# Patient Record
Sex: Female | Born: 1960 | Race: White | Hispanic: No | Marital: Married | Smoking: Never smoker
Health system: Southern US, Community
[De-identification: ages and names within clinical notes are randomized; demographics above are authoritative.]

## PROBLEM LIST (undated history)

## (undated) DIAGNOSIS — D869 Sarcoidosis, unspecified: Secondary | ICD-10-CM

## (undated) DIAGNOSIS — J3800 Paralysis of vocal cords and larynx, unspecified: Secondary | ICD-10-CM

## (undated) DIAGNOSIS — T8859XA Other complications of anesthesia, initial encounter: Secondary | ICD-10-CM

## (undated) DIAGNOSIS — K635 Polyp of colon: Secondary | ICD-10-CM

## (undated) DIAGNOSIS — E785 Hyperlipidemia, unspecified: Secondary | ICD-10-CM

## (undated) DIAGNOSIS — T7840XA Allergy, unspecified, initial encounter: Secondary | ICD-10-CM

## (undated) DIAGNOSIS — J3801 Paralysis of vocal cords and larynx, unilateral: Secondary | ICD-10-CM

## (undated) DIAGNOSIS — I712 Thoracic aortic aneurysm, without rupture, unspecified: Secondary | ICD-10-CM

## (undated) DIAGNOSIS — G902 Horner's syndrome: Secondary | ICD-10-CM

## (undated) DIAGNOSIS — Z8719 Personal history of other diseases of the digestive system: Secondary | ICD-10-CM

## (undated) DIAGNOSIS — T4145XA Adverse effect of unspecified anesthetic, initial encounter: Secondary | ICD-10-CM

## (undated) DIAGNOSIS — K2970 Gastritis, unspecified, without bleeding: Secondary | ICD-10-CM

## (undated) DIAGNOSIS — R0600 Dyspnea, unspecified: Secondary | ICD-10-CM

## (undated) DIAGNOSIS — F419 Anxiety disorder, unspecified: Secondary | ICD-10-CM

## (undated) DIAGNOSIS — K219 Gastro-esophageal reflux disease without esophagitis: Secondary | ICD-10-CM

## (undated) DIAGNOSIS — M810 Age-related osteoporosis without current pathological fracture: Secondary | ICD-10-CM

## (undated) HISTORY — DX: Horner's syndrome: G90.2

## (undated) HISTORY — DX: Gastro-esophageal reflux disease without esophagitis: K21.9

## (undated) HISTORY — DX: Thoracic aortic aneurysm, without rupture, unspecified: I71.20

## (undated) HISTORY — DX: Age-related osteoporosis without current pathological fracture: M81.0

## (undated) HISTORY — DX: Allergy, unspecified, initial encounter: T78.40XA

## (undated) HISTORY — DX: Hyperlipidemia, unspecified: E78.5

## (undated) HISTORY — DX: Paralysis of vocal cords and larynx, unspecified: J38.00

## (undated) HISTORY — PX: TONSILLECTOMY: SUR1361

## (undated) HISTORY — DX: Gastritis, unspecified, without bleeding: K29.70

## (undated) HISTORY — DX: Sarcoidosis, unspecified: D86.9

## (undated) HISTORY — DX: Anxiety disorder, unspecified: F41.9

## (undated) HISTORY — DX: Thoracic aortic aneurysm, without rupture: I71.2

## (undated) HISTORY — DX: Polyp of colon: K63.5

## (undated) SURGERY — BRONCHOSCOPY, WITH FLUOROSCOPY
Anesthesia: Moderate Sedation

---

## 1998-05-20 ENCOUNTER — Encounter: Admission: RE | Admit: 1998-05-20 | Discharge: 1998-05-20 | Payer: Self-pay | Admitting: *Deleted

## 1998-11-20 ENCOUNTER — Other Ambulatory Visit: Admission: RE | Admit: 1998-11-20 | Discharge: 1998-11-20 | Payer: Self-pay | Admitting: Obstetrics and Gynecology

## 1998-12-26 ENCOUNTER — Encounter: Payer: Self-pay | Admitting: Obstetrics and Gynecology

## 1998-12-26 ENCOUNTER — Ambulatory Visit (HOSPITAL_COMMUNITY): Admission: RE | Admit: 1998-12-26 | Discharge: 1998-12-26 | Payer: Self-pay | Admitting: Obstetrics and Gynecology

## 2000-04-06 ENCOUNTER — Other Ambulatory Visit: Admission: RE | Admit: 2000-04-06 | Discharge: 2000-04-06 | Payer: Self-pay | Admitting: Obstetrics and Gynecology

## 2001-10-26 ENCOUNTER — Encounter: Payer: Self-pay | Admitting: *Deleted

## 2001-10-26 ENCOUNTER — Encounter: Admission: RE | Admit: 2001-10-26 | Discharge: 2001-10-26 | Payer: Self-pay | Admitting: *Deleted

## 2002-02-27 ENCOUNTER — Other Ambulatory Visit: Admission: RE | Admit: 2002-02-27 | Discharge: 2002-02-27 | Payer: Self-pay | Admitting: Obstetrics and Gynecology

## 2002-06-13 ENCOUNTER — Other Ambulatory Visit: Admission: RE | Admit: 2002-06-13 | Discharge: 2002-06-13 | Payer: Self-pay

## 2003-02-26 ENCOUNTER — Emergency Department (HOSPITAL_COMMUNITY): Admission: EM | Admit: 2003-02-26 | Discharge: 2003-02-26 | Payer: Self-pay | Admitting: Emergency Medicine

## 2003-02-28 ENCOUNTER — Emergency Department (HOSPITAL_COMMUNITY): Admission: EM | Admit: 2003-02-28 | Discharge: 2003-02-28 | Payer: Self-pay | Admitting: *Deleted

## 2003-03-03 ENCOUNTER — Emergency Department (HOSPITAL_COMMUNITY): Admission: EM | Admit: 2003-03-03 | Discharge: 2003-03-03 | Payer: Self-pay

## 2003-03-07 ENCOUNTER — Encounter (HOSPITAL_COMMUNITY): Admission: RE | Admit: 2003-03-07 | Discharge: 2003-03-29 | Payer: Self-pay | Admitting: Family Medicine

## 2005-08-13 ENCOUNTER — Encounter: Admission: RE | Admit: 2005-08-13 | Discharge: 2005-08-13 | Payer: Self-pay | Admitting: Family Medicine

## 2007-06-20 ENCOUNTER — Encounter: Admission: RE | Admit: 2007-06-20 | Discharge: 2007-06-20 | Payer: Self-pay | Admitting: Obstetrics and Gynecology

## 2008-03-15 ENCOUNTER — Encounter: Admission: RE | Admit: 2008-03-15 | Discharge: 2008-03-15 | Payer: Self-pay | Admitting: Family Medicine

## 2011-01-06 ENCOUNTER — Other Ambulatory Visit: Payer: Self-pay | Admitting: Internal Medicine

## 2011-01-06 DIAGNOSIS — Z1231 Encounter for screening mammogram for malignant neoplasm of breast: Secondary | ICD-10-CM

## 2011-01-13 ENCOUNTER — Ambulatory Visit
Admission: RE | Admit: 2011-01-13 | Discharge: 2011-01-13 | Disposition: A | Discharge status: Home / Self Care | Payer: PRIVATE HEALTH INSURANCE | Source: Ambulatory Visit | Attending: Internal Medicine | Admitting: Internal Medicine

## 2011-01-13 DIAGNOSIS — Z1231 Encounter for screening mammogram for malignant neoplasm of breast: Secondary | ICD-10-CM

## 2012-07-31 ENCOUNTER — Other Ambulatory Visit: Payer: Self-pay | Admitting: Internal Medicine

## 2012-07-31 DIAGNOSIS — Z1231 Encounter for screening mammogram for malignant neoplasm of breast: Secondary | ICD-10-CM

## 2012-09-06 ENCOUNTER — Ambulatory Visit
Admission: RE | Admit: 2012-09-06 | Discharge: 2012-09-06 | Disposition: A | Discharge status: Home / Self Care | Payer: PRIVATE HEALTH INSURANCE | Source: Ambulatory Visit | Attending: Internal Medicine | Admitting: Internal Medicine

## 2012-09-06 DIAGNOSIS — Z1231 Encounter for screening mammogram for malignant neoplasm of breast: Secondary | ICD-10-CM

## 2012-09-07 ENCOUNTER — Other Ambulatory Visit: Payer: Self-pay | Admitting: Internal Medicine

## 2012-09-07 DIAGNOSIS — R928 Other abnormal and inconclusive findings on diagnostic imaging of breast: Secondary | ICD-10-CM

## 2012-09-22 ENCOUNTER — Other Ambulatory Visit: Payer: Self-pay | Admitting: Obstetrics and Gynecology

## 2012-09-22 DIAGNOSIS — R928 Other abnormal and inconclusive findings on diagnostic imaging of breast: Secondary | ICD-10-CM

## 2012-11-07 ENCOUNTER — Ambulatory Visit
Admission: RE | Admit: 2012-11-07 | Discharge: 2012-11-07 | Disposition: A | Discharge status: Home / Self Care | Payer: PRIVATE HEALTH INSURANCE | Source: Ambulatory Visit | Attending: Obstetrics and Gynecology | Admitting: Obstetrics and Gynecology

## 2012-11-07 DIAGNOSIS — R928 Other abnormal and inconclusive findings on diagnostic imaging of breast: Secondary | ICD-10-CM

## 2015-12-15 ENCOUNTER — Encounter: Payer: Self-pay | Admitting: Internal Medicine

## 2016-02-09 ENCOUNTER — Ambulatory Visit: Payer: PRIVATE HEALTH INSURANCE | Admitting: Internal Medicine

## 2016-02-18 ENCOUNTER — Ambulatory Visit (INDEPENDENT_AMBULATORY_CARE_PROVIDER_SITE_OTHER): Payer: Commercial Managed Care - PPO | Admitting: Internal Medicine

## 2016-02-18 ENCOUNTER — Encounter: Payer: Self-pay | Admitting: Internal Medicine

## 2016-02-18 VITALS — BP 100/60 | HR 66 | Ht 67.0 in | Wt 125.0 lb

## 2016-02-18 DIAGNOSIS — J385 Laryngeal spasm: Secondary | ICD-10-CM

## 2016-02-18 DIAGNOSIS — Z86010 Personal history of colonic polyps: Secondary | ICD-10-CM

## 2016-02-18 DIAGNOSIS — J387 Other diseases of larynx: Secondary | ICD-10-CM

## 2016-02-18 DIAGNOSIS — K219 Gastro-esophageal reflux disease without esophagitis: Secondary | ICD-10-CM

## 2016-02-18 DIAGNOSIS — Z800 Family history of malignant neoplasm of digestive organs: Secondary | ICD-10-CM | POA: Diagnosis not present

## 2016-02-18 DIAGNOSIS — D860 Sarcoidosis of lung: Secondary | ICD-10-CM

## 2016-02-18 MED ORDER — OMEPRAZOLE 20 MG PO CPDR: 20.0000 mg | DELAYED_RELEASE_CAPSULE | Freq: Every day | ORAL | Status: DC

## 2016-02-18 NOTE — Progress Notes (Signed)
Patient ID: Natasha Fowler, female   DOB: 1961/04/07, 55 y.o.   MRN: [id number] HPI: Natasha Fowler is a 55 year old female with past medical history of GERD, hyperlipidemia, pulmonary sarcoid and anxiety who is seen to evaluate GERD and LPR symptoms. She is here alone today. She reports that she has been unable to get her reflux and control over the last few months. She reports that she has had episodes of coughing particularly at night, spasm in her upper throat which makes it very difficult for her to breathe and provokes anxiety along with hoarseness. She has occasional heartburn. When she is eating solid food she denies dysphagia. Denies liquid dysphagia. Trouble swallowing occurs when she feels regurgitation and then coughing. She reports that she has been evaluated by Dr. Shana Chute in [facility] where multiple medications were tried and she underwent upper endoscopy and colonoscopy. She recalls previously being on multiple PPIs without benefit. He reports she tried a "holistic" approach and was seen at [facility] where she was told that she likely had acid deficit rather than too much acid. She was placed on a regimen of Betaine HCL, DHEA, enzyme aid digestion, and a probiotic. With this combination she reports "all of my symptoms get better". She reports being then seen by her primary care and she was told that this regimen was "ridiculous" and that she is setting herself up for "esophageal cancer". Her primary care than prescribed pantoprazole 40 mg daily which she states made her significantly worse. She has been off of pantoprazole for about a month and for the last 2 weeks been using branded Prilosec 20 mg a day. She recalls previously having tried Nexium and a higher dose of omeprazole. She feels that with Prilosec 20 mg daily her symptoms have been slightly improving. She has been seen by an ear nose and throat doctor and told that her symptoms were secondary to GERD after evaluation  including laryngoscopy. She does endorse lactose intolerance with a "tummyache" and loose stools if she has milk or ice cream. She avoids lactose. No other abdominal pain. Bowel habits have been regular without blood in her stool or melena.  She does describe at times she will wake up with regurgitation and developing severe tightness and the inability to breathe for a few seconds. She reports this induces panic and is very stressful for her.  Colonoscopy dated 09/17/2013 -- diminutive polyp at the appendiceal orifice removed with cold biopsy. Pathology = normal colonic mucosa. Otherwise normal exam Upper endoscopy performed 12/18/2013 -- regular and crisp Z line with nonobstructing esophageal ring at GE junction.  Feline appearance in esophageal mucosa which was biopsied. Hiatal hernia. Gastric erythema which was biopsied. Diminutive gastric polyp biopsied. Normal duodenum. Pathology = benign gastric mucosa with mild chronic gastritis. Negative for H. pylori. Gastric polyp consistent with fundic gland polyp. Esophageal biopsies benign squamous mucosa with no significant inflammation. No evidence of EoE  Past Medical History  Diagnosis Date  . Anxiety   . Arthritis   . GERD (gastroesophageal reflux disease)   . Hyperlipidemia   . Colon polyp     Past Surgical History  Procedure Laterality Date  . Tonsillectomy      age 16 , have grown back per Dr    No outpatient prescriptions prior to visit.   No facility-administered medications prior to visit.    Allergies  Allergen Reactions  . Penicillins Anaphylaxis    Throat swelling  . Asa [Aspirin] Other (See Comments)  GI upset  . Codeine Hives  . Sulfa Antibiotics Hives    Family History  Problem Relation Age of Onset  . Colon cancer Maternal Grandmother     in her 49's  . Colon polyps Mother     Social History  Substance Use Topics  . Smoking status: Never Smoker   . Smokeless tobacco: Never Used  . Alcohol Use: 0.0  oz/week    0 Standard drinks or equivalent per week     Comment: occ.    ROS: As per history of present illness, otherwise negative  BP 100/60 mmHg  Pulse 66  Ht 5\' 7"  (1.702 m)  Wt 125 lb (56.7 kg)  BMI 19.57 kg/m2 Constitutional: Well-developed and well-nourished. No distress. HEENT: Normocephalic and atraumatic. Oropharynx is clear and moist. No oropharyngeal exudate. Conjunctivae are normal.  No scleral icterus. Neck: Neck supple. Trachea midline. Cardiovascular: Normal rate, regular rhythm and intact distal pulses. No M/R/G Pulmonary/chest: Effort normal and breath sounds normal. No wheezing, rales or rhonchi. Abdominal: Soft, thin, nontender, nondistended. Bowel sounds active throughout. There are no masses palpable.  Extremities: no clubbing, cyanosis, or edema Lymphadenopathy: No cervical adenopathy noted. Neurological: Alert and oriented to person place and time. Skin: Skin is warm and dry. No rashes noted. Psychiatric: Normal mood and affect. Behavior is normal.  ASSESSMENT/PLAN: 55 year old female with past medical history of GERD, hyperlipidemia, pulmonary sarcoid and anxiety who is seen to evaluate GERD and LPR symptoms.  1. GERD/LPR -- she does have symptoms consistent with gastroesophageal reflux disease. We discussed dietary modification including decreasing her caffeine intake. She drinks considerable amounts of caffeine in the form of coffee on a daily basis. She seems to be improving with Prilosec 20 mg daily. I'm going to continue this dose. Given her dramatic response to the supplements started by integrative [facility], I recommended that she resume DHEA, probiotic and the digestive enzyme. I do not think she should take HCl. I recommended that she prop the head of her bed to help prevent nocturnal reflux. She does have classic LPR symptoms which should improve with overall improvement in GERD. Her symptoms also fit with laryngospasm and I have recommended that she be  evaluated by in pulmonary clinic. She's being referred to see Dr. Lake Bells. She also has history of pulmonary sarcoid.  2. CRC screening -- family history of colon cancer in her grandmother on her mother's side. Her mother had colon polyps which were precancerous. I recommended repeat colonoscopy in December 2019.

## 2016-02-18 NOTE — Patient Instructions (Addendum)
We have sent the following medications to your pharmacy for you to pick up at your convenience: Prilosec 20 mg daily  Patient advised to avoid spicy, acidic, citrus, chocolate, mints, fruit and fruit juices.  Limit the intake of caffeine, alcohol and Soda.  Don't exercise too soon after eating.  Don't lie down within 3-4 hours of eating.  Elevate the head of your bed.  Follow a GERD diet.  Decrease caffeine intake.  You may use your DHEA probiotic and digestive enzyme that was previously working well for you.  You have been scheduled for an appointment with Dr Roselie Awkward at [facility] (2nd floor of [place]) on Mar 11, 2016 @ 9:00 am with an 8:45 am arrival. Please make certain to bring your insurance cards, copay and updated medication list, including any over the counter medications/supplements.  You will be due for a recall colonoscopy in 09/2018. We will send you a reminder in the mail when it gets closer to that time.   If you are age 38 or older, your body mass index should be between 23-30. Your Body mass index is 19.57 kg/(m^2). If this is out of the aforementioned range listed, please consider follow up with your Primary Care Provider.  If you are age 40 or younger, your body mass index should be between 19-25. Your Body mass index is 19.57 kg/(m^2). If this is out of the aformentioned range listed, please consider follow up with your Primary Care Provider.

## 2016-03-11 ENCOUNTER — Encounter: Payer: Self-pay | Admitting: Pulmonary Disease

## 2016-03-11 ENCOUNTER — Ambulatory Visit (INDEPENDENT_AMBULATORY_CARE_PROVIDER_SITE_OTHER): Payer: Commercial Managed Care - PPO | Admitting: Pulmonary Disease

## 2016-03-11 VITALS — BP 124/68 | HR 66 | Ht 67.0 in | Wt 123.6 lb

## 2016-03-11 DIAGNOSIS — R0600 Dyspnea, unspecified: Secondary | ICD-10-CM | POA: Diagnosis not present

## 2016-03-11 DIAGNOSIS — D860 Sarcoidosis of lung: Secondary | ICD-10-CM | POA: Insufficient documentation

## 2016-03-11 DIAGNOSIS — R059 Cough, unspecified: Secondary | ICD-10-CM

## 2016-03-11 DIAGNOSIS — R9389 Abnormal findings on diagnostic imaging of other specified body structures: Secondary | ICD-10-CM

## 2016-03-11 DIAGNOSIS — R05 Cough: Secondary | ICD-10-CM | POA: Diagnosis not present

## 2016-03-11 DIAGNOSIS — R0602 Shortness of breath: Secondary | ICD-10-CM | POA: Diagnosis not present

## 2016-03-11 DIAGNOSIS — R938 Abnormal findings on diagnostic imaging of other specified body structures: Secondary | ICD-10-CM

## 2016-03-11 NOTE — Assessment & Plan Note (Signed)
She has been referred to me to evaluate shortness of breath, cough, in the setting of an underlying doses of sarcoidosis. It sounds as if her shortness of breath has been progressing over the last several months. I'm encouraged by the fact that her lungs were clear on exam today and she has normal oximetry.  However, she does have an abnormal CT scan from 2015 which showed scattered pulmonary nodules in a bronchovascular distribution with otherwise normal appearing pulmonary parenchyma. She is a lifelong nonsmoker.  It's not clear to me if the abnormal CT scan is the cause of her shortness of breath. See my discussion below.  I have a high index of suspicion for vocal cord dysfunction considering the episodic episode of the dyspnea, the upper airway wheezing, and her self-described very stressed life.  Plan: Pulmonary function testing High-resolution CT scan of the chest Obtain records from her prior pulmonologist including pulmonary function testing in 2015 If there is no evidence of progression of her ill-defined underlying pulmonary parenchymal problem then we will consider a bronchoscopy with biopsy versus evaluation of vocal cord dysfunction with ear nose and throat at [facility].

## 2016-03-11 NOTE — Progress Notes (Signed)
Subjective:    Patient ID: Natasha Fowler, female    DOB: Dec 06, 1960, 55 y.o.   MRN: [id number]  HPI Chief Complaint  Patient presents with  . Advice Only    Referred by Dr. Hilarie Fredrickson for Sarcoidosis.  Pt states 'a flap in her throat closes when coughing', unable to catch breath for several seconds- s/s present X3 mos.      Natasha Fowler was referred to me by Dr. Hilarie Fredrickson and is here today to establish care with a pulmonary group for two reasons: 1) "Extreme shortness of breath" She says that this occurs at rest and on exertion and sometimes just with coughing.  She describes progressive dyspnea, particularly in the last three months.  She says that this started several years ago but it was very subtle at that time.  However in the last few months this has progressed.  She feels that talking makes her run out of air.  Exertion such as climbing stairs, walking long distances on flat ground.  She feels scared when these episodes occur.  When she gets short of breath she feels like she "runs out of air" and she has to stop talking or walking and then it goes away.  These episodes are associated with hoarseness which worsens with these episodes.  She does not have chest pain when this happens.  She has noted wheezing perhaps 10% of the time this happens.  She describes the wheeze when she is otuside around tree pollen and it usually happens by a cough.  This wheezing typically occurs with sneezing, itchy, watery eyes and post nasal drip.  She takes zyrtec, but no nasal spray.    2) Cough She describes this as uncontrollable coughing.  She notes that this first happened suddenly when she was in the yard talking to the "gas man".  She started to have a severe episode of cough followed by dyspnea with a loud upper airway wheeze.  She said it felt like a flap had closed in her mouth and eventually she was able to breathe with some difficulty.  She saw her PCP for this was was described by him as a panic  attack.  She has dealt with cough for some time prior to this episode with the gas man.  She has since seen GI (pyrtle) and her cough has improved significantly with the addition of a PPI.   3) Sarcoidosis This was diagnosed in 2015. She was having a pain in her right abdomen at the time and she ended up having a CT scan.  Scarring vs nodules were seen in her lungs. She was seen by pulmonologist who told her that she had sarcoidosis.  She never had a biopsy or a tissue diagnosis. She was told to follow up but this never happened.    Past Medical History  Diagnosis Date  . Anxiety   . Arthritis   . GERD (gastroesophageal reflux disease)   . Hyperlipidemia   . Colon polyp      Family History  Problem Relation Age of Onset  . Colon cancer Maternal Grandmother     in her 7's  . Colon polyps Mother   . Heart disease Father      Social History   Social History  . Marital Status: Married    Spouse Name: N/A  . Number of Children: 1  . Years of Education: N/A   Occupational History  . general contractor    Social History Main Topics  . Smoking  status: Never Smoker   . Smokeless tobacco: Never Used  . Alcohol Use: 0.0 oz/week    0 Standard drinks or equivalent per week     Comment: occ.  . Drug Use: No  . Sexual Activity: Not on file   Other Topics Concern  . Not on file   Social History Narrative     Allergies  Allergen Reactions  . Penicillins Anaphylaxis    Throat swelling  . Asa [Aspirin] Other (See Comments)    GI upset  . Codeine Hives  . Sulfa Antibiotics Hives     Outpatient Prescriptions Prior to Visit  Medication Sig Dispense Refill  . estradiol-norethindrone (COMBIPATCH) 0.05-0.14 MG/DAY Place 1 patch onto the skin once a week.    Marland Kitchen omeprazole (PRILOSEC) 20 MG capsule Take 1 capsule (20 mg total) by mouth daily. Brand name prilosec only 30 capsule 2   No facility-administered medications prior to visit.       Review of Systems   Constitutional: Negative for fever and unexpected weight change.  HENT: Positive for congestion, sinus pressure and sneezing. Negative for dental problem, ear pain, nosebleeds, postnasal drip, rhinorrhea, sore throat and trouble swallowing.   Eyes: Negative for redness and itching.  Respiratory: Positive for cough and shortness of breath. Negative for chest tightness and wheezing.   Cardiovascular: Negative for palpitations and leg swelling.  Gastrointestinal: Negative for nausea and vomiting.  Genitourinary: Negative for dysuria.  Musculoskeletal: Negative for joint swelling.  Skin: Negative for rash.  Neurological: Negative for headaches.  Hematological: Does not bruise/bleed easily.  Psychiatric/Behavioral: Negative for dysphoric mood. The patient is not nervous/anxious.        Objective:   Physical Exam Filed Vitals:   03/11/16 0901  BP: 124/68  Pulse: 66  Height: 5\' 7"  (1.702 m)  Weight: 123 lb 9.6 oz (56.065 kg)  SpO2: 100%   RA  Gen: well appearing, no acute distress HENT: NCAT, OP clear, neck supple without masses Eyes: PERRL, EOMi Lymph: no cervical lymphadenopathy PULM: CTA B CV: RRR, no mgr, no JVD GI: BS+, soft, nontender, no hsm Derm: no rash or skin breakdown MSK: normal bulk and tone Neuro: A&Ox4, CN II-XII intact, strength 5/5 in all 4 extremities Psyche: normal mood and affect    Records from Dr. Hilarie Fredrickson were reviewed where she was treated for gastroesophageal reflux disease. Images from her 2015 CT chest for personally reviewed, see my summary below.      Assessment & Plan:  Dyspnea She has been referred to me to evaluate shortness of breath, cough, in the setting of an underlying doses of sarcoidosis. It sounds as if her shortness of breath has been progressing over the last several months. I'm encouraged by the fact that her lungs were clear on exam today and she has normal oximetry.  However, she does have an abnormal CT scan from 2015 which  showed scattered pulmonary nodules in a bronchovascular distribution with otherwise normal appearing pulmonary parenchyma. She is a lifelong nonsmoker.  It's not clear to me if the abnormal CT scan is the cause of her shortness of breath. See my discussion below.  I have a high index of suspicion for vocal cord dysfunction considering the episodic episode of the dyspnea, the upper airway wheezing, and her self-described very stressed life.  Plan: Pulmonary function testing High-resolution CT scan of the chest Obtain records from her prior pulmonologist including pulmonary function testing in 2015 If there is no evidence of progression of her ill-defined underlying  pulmonary parenchymal problem then we will consider a bronchoscopy with biopsy versus evaluation of vocal cord dysfunction with ear nose and throat at [facility].  Abnormal CT scan, chest I have personally reviewed the images from her CT chest which showed scattered and spiculated appearing pulmonary nodules in a bronchovascular distribution with central bronchiectasis. The differential diagnosis for this condition is broad. She was previously told that she had pulmonary sarcoidosis but this diagnosis cannot be made without a tissue biopsy. Differential diagnosis for this condition is broad and includes allergic bronchopulmonary aspergillosis among other problems.  Plan: Because of the increase in symptoms she's been experiencing recently we need to repeat a high-resolution CT chest to see if that there has been progression since the prior study. Depending on the results of the repeat CT scan we may need to perform a bronchoscopy  Cough The differential diagnosis for the etiology of her cough is broad but I think the most likely causes are acid reflux as well as allergic rhinitis. She describes symptoms of both of these underlying conditions and she notes significant improvement with cough with the addition of a proton pump  inhibitor.  Plan: Continue proton pump inhibitor as prescribed by her gastroenterologist We did review lifestyle and dietary modifications to improve gastroesophageal reflux symptoms     Current outpatient prescriptions:  .  estradiol-norethindrone (COMBIPATCH) 0.05-0.14 MG/DAY, Place 1 patch onto the skin once a week., Disp: , Rfl:  .  omeprazole (PRILOSEC) 20 MG capsule, Take 1 capsule (20 mg total) by mouth daily. Brand name prilosec only, Disp: 30 capsule, Rfl: 2

## 2016-03-11 NOTE — Assessment & Plan Note (Signed)
I have personally reviewed the images from her CT chest which showed scattered and spiculated appearing pulmonary nodules in a bronchovascular distribution with central bronchiectasis. The differential diagnosis for this condition is broad. She was previously told that she had pulmonary sarcoidosis but this diagnosis cannot be made without a tissue biopsy. Differential diagnosis for this condition is broad and includes allergic bronchopulmonary aspergillosis among other problems.  Plan: Because of the increase in symptoms she's been experiencing recently we need to repeat a high-resolution CT chest to see if that there has been progression since the prior study. Depending on the results of the repeat CT scan we may need to perform a bronchoscopy

## 2016-03-11 NOTE — Assessment & Plan Note (Signed)
The differential diagnosis for the etiology of her cough is broad but I think the most likely causes are acid reflux as well as allergic rhinitis. She describes symptoms of both of these underlying conditions and she notes significant improvement with cough with the addition of a proton pump inhibitor.  Plan: Continue proton pump inhibitor as prescribed by her gastroenterologist We did review lifestyle and dietary modifications to improve gastroesophageal reflux symptoms

## 2016-03-11 NOTE — Patient Instructions (Signed)
We will order a pulmonary function test and a high-resolution CT scan of your chest to evaluate the cause of her shortness of breath We will request records from your prior pulmonologist I will see you back in 4-6 weeks or sooner if needed

## 2016-03-16 ENCOUNTER — Ambulatory Visit (INDEPENDENT_AMBULATORY_CARE_PROVIDER_SITE_OTHER)
Admission: RE | Admit: 2016-03-16 | Discharge: 2016-03-16 | Disposition: A | Discharge status: Home / Self Care | Payer: Commercial Managed Care - PPO | Source: Ambulatory Visit | Attending: Pulmonary Disease | Admitting: Pulmonary Disease

## 2016-03-16 DIAGNOSIS — R9389 Abnormal findings on diagnostic imaging of other specified body structures: Secondary | ICD-10-CM

## 2016-03-16 DIAGNOSIS — R05 Cough: Secondary | ICD-10-CM

## 2016-03-16 DIAGNOSIS — R059 Cough, unspecified: Secondary | ICD-10-CM

## 2016-03-16 DIAGNOSIS — R0602 Shortness of breath: Secondary | ICD-10-CM | POA: Diagnosis not present

## 2016-03-19 ENCOUNTER — Telehealth: Payer: Self-pay | Admitting: Internal Medicine

## 2016-03-19 ENCOUNTER — Other Ambulatory Visit: Payer: Self-pay | Admitting: Obstetrics and Gynecology

## 2016-03-19 DIAGNOSIS — Z1231 Encounter for screening mammogram for malignant neoplasm of breast: Secondary | ICD-10-CM

## 2016-03-22 NOTE — Telephone Encounter (Signed)
Has she been taking the supplements recommended by [facility]? Change omeprazole to ranitidine 150 mg BID Pantoprazole previously made symptoms worse per patient

## 2016-03-22 NOTE — Telephone Encounter (Signed)
Left message for pt to call back  °

## 2016-03-22 NOTE — Telephone Encounter (Signed)
Pt states her reflux is worse than it has been, states the omeprazole 20mg  is not helping at all. Pt wants to know if there is something else that she can take. Please advise.

## 2016-03-23 MED ORDER — RANITIDINE HCL 150 MG PO CAPS: 150.0000 mg | ORAL_CAPSULE | Freq: Two times a day (BID) | ORAL | Status: DC

## 2016-03-23 NOTE — Addendum Note (Signed)
Addended by: Rosanne Sack R on: 03/23/2016 11:00 AM   Modules accepted: Orders

## 2016-03-23 NOTE — Progress Notes (Signed)
Quick Note:  Spoke with pt and notified of results per Dr. Melvyn Novas. Pt verbalized understanding and denied any questions. She is going to call us back and let us know about which day she can do bronch  Will hold is AC's result basket ______

## 2016-03-23 NOTE — Telephone Encounter (Signed)
Spoke with pt and she is aware, script sent to pharmacy. 

## 2016-03-31 ENCOUNTER — Telehealth: Payer: Self-pay | Admitting: Pulmonary Disease

## 2016-03-31 NOTE — Telephone Encounter (Signed)
Yes, either day fine.  Needs to be at [facility] in the morning only.  Fluoro yes, no TB risk

## 2016-03-31 NOTE — Telephone Encounter (Signed)
Spoke with the pt  She states that she is able to do bronch on Monday 19th or Tues 20th  Please advise if either of these dates work for you, thanks

## 2016-04-01 NOTE — Telephone Encounter (Signed)
Pt returning call to get info on appointments. Please call back

## 2016-04-01 NOTE — Telephone Encounter (Signed)
Spoke with Baxter Flattery at respiratory, Natasha Fowler is scheduled for Tuesday, June 20th at 11:00.   lmtcb X1 for pt to make aware of date/time. Forwarding to BQ as FYI

## 2016-04-01 NOTE — Telephone Encounter (Signed)
lmtcb for pt.  

## 2016-04-01 NOTE — Telephone Encounter (Signed)
Pt cb, I informed of dated and time of appt, also informed patient not to eat or drink after midnight the night before, and to arrange for someone to come with her to appt to drive her home, patient verbalized understanding and nothing further is needed

## 2016-04-06 ENCOUNTER — Ambulatory Visit (HOSPITAL_COMMUNITY): Payer: Commercial Managed Care - PPO

## 2016-04-06 ENCOUNTER — Telehealth: Payer: Self-pay | Admitting: Internal Medicine

## 2016-04-06 ENCOUNTER — Encounter (HOSPITAL_COMMUNITY): Payer: Self-pay | Admitting: Respiratory Therapy

## 2016-04-06 ENCOUNTER — Encounter (HOSPITAL_COMMUNITY)
Admission: RE | Disposition: A | Discharge status: Home / Self Care | Payer: Self-pay | Source: Ambulatory Visit | Attending: Pulmonary Disease

## 2016-04-06 ENCOUNTER — Ambulatory Visit (HOSPITAL_COMMUNITY)
Admission: RE | Admit: 2016-04-06 | Discharge: 2016-04-06 | Disposition: A | Discharge status: Home / Self Care | Payer: Commercial Managed Care - PPO | Source: Ambulatory Visit | Attending: Pulmonary Disease | Admitting: Pulmonary Disease

## 2016-04-06 DIAGNOSIS — J3801 Paralysis of vocal cords and larynx, unilateral: Secondary | ICD-10-CM | POA: Diagnosis not present

## 2016-04-06 DIAGNOSIS — R938 Abnormal findings on diagnostic imaging of other specified body structures: Secondary | ICD-10-CM

## 2016-04-06 DIAGNOSIS — M1990 Unspecified osteoarthritis, unspecified site: Secondary | ICD-10-CM | POA: Insufficient documentation

## 2016-04-06 DIAGNOSIS — J984 Other disorders of lung: Secondary | ICD-10-CM | POA: Insufficient documentation

## 2016-04-06 DIAGNOSIS — Z9889 Other specified postprocedural states: Secondary | ICD-10-CM

## 2016-04-06 DIAGNOSIS — J3800 Paralysis of vocal cords and larynx, unspecified: Secondary | ICD-10-CM

## 2016-04-06 DIAGNOSIS — R05 Cough: Secondary | ICD-10-CM | POA: Diagnosis not present

## 2016-04-06 DIAGNOSIS — D860 Sarcoidosis of lung: Secondary | ICD-10-CM

## 2016-04-06 HISTORY — PX: VIDEO BRONCHOSCOPY: SHX5072

## 2016-04-06 HISTORY — DX: Paralysis of vocal cords and larynx, unilateral: J38.01

## 2016-04-06 LAB — BODY FLUID CELL COUNT WITH DIFFERENTIAL
EOS FL: 2 %
Lymphs, Fluid: 10 %
MONOCYTE-MACROPHAGE-SEROUS FLUID: 61 % (ref 50–90)
NEUTROPHIL FLUID: 27 % — AB (ref 0–25)
WBC FLUID: 13 uL (ref 0–1000)

## 2016-04-06 SURGERY — BRONCHOSCOPY, WITH FLUOROSCOPY
Anesthesia: Moderate Sedation | Laterality: Bilateral

## 2016-04-06 MED ORDER — SODIUM CHLORIDE 0.9 % IV SOLN
INTRAVENOUS | Status: DC
  Administered 2016-04-06: 11:00:00 via INTRAVENOUS

## 2016-04-06 MED ORDER — FENTANYL CITRATE (PF) 100 MCG/2ML IJ SOLN
INTRAMUSCULAR | Status: DC | PRN
  Administered 2016-04-06 (×2): 25 ug via INTRAVENOUS
  Administered 2016-04-06: 50 ug via INTRAVENOUS
  Administered 2016-04-06: 25 ug via INTRAVENOUS

## 2016-04-06 MED ORDER — MIDAZOLAM HCL 10 MG/2ML IJ SOLN
INTRAMUSCULAR | Status: DC | PRN
  Administered 2016-04-06 (×2): 1 mg via INTRAVENOUS
  Administered 2016-04-06: 2 mg via INTRAVENOUS

## 2016-04-06 MED ORDER — LIDOCAINE HCL 1 % IJ SOLN
INTRAMUSCULAR | Status: DC | PRN
  Administered 2016-04-06: 6 mL via RESPIRATORY_TRACT

## 2016-04-06 MED ORDER — LIDOCAINE HCL 2 % EX GEL
CUTANEOUS | Status: DC | PRN
  Administered 2016-04-06: 1

## 2016-04-06 MED ORDER — LIDOCAINE HCL 2 % EX GEL: 1.0000 "application " | Freq: Once | CUTANEOUS | Status: DC

## 2016-04-06 MED ORDER — BUTAMBEN-TETRACAINE-BENZOCAINE 2-2-14 % EX AERO: 1.0000 | INHALATION_SPRAY | Freq: Once | CUTANEOUS | Status: DC

## 2016-04-06 MED ORDER — MIDAZOLAM HCL 5 MG/ML IJ SOLN
INTRAMUSCULAR | Status: AC
  Filled 2016-04-06: qty 2

## 2016-04-06 MED ORDER — PHENYLEPHRINE HCL 0.25 % NA SOLN: 1.0000 | Freq: Four times a day (QID) | NASAL | Status: DC | PRN

## 2016-04-06 MED ORDER — FENTANYL CITRATE (PF) 100 MCG/2ML IJ SOLN
INTRAMUSCULAR | Status: AC
  Filled 2016-04-06: qty 4

## 2016-04-06 MED ORDER — PHENYLEPHRINE HCL 0.25 % NA SOLN
NASAL | Status: DC | PRN
  Administered 2016-04-06: 2 via NASAL

## 2016-04-06 NOTE — Discharge Instructions (Signed)
Flexible Bronchoscopy, Care After These instructions give you information on caring for yourself after your procedure. Your doctor may also give you more specific instructions. Call your doctor if you have any problems or questions after your procedure. HOME CARE  Do not eat or drink anything for 2 hours after your procedure. If you try to eat or drink before the medicine wears off, food or drink could go into your lungs. You could also burn yourself.  After 2 hours have passed and when you can cough and gag normally, you may eat soft food and drink liquids slowly.  The day after the test, you may eat your normal diet.  You may do your normal activities.  Keep all doctor visits. GET HELP RIGHT AWAY IF:  You get more and more short of breath.  You get light-headed.  You feel like you are going to pass out (faint).  You have chest pain.  You have new problems that worry you.  You cough up more than a little blood.  You cough up more blood than before. MAKE SURE YOU:  Understand these instructions.  Will watch your condition.  Will get help right away if you are not doing well or get worse.   This information is not intended to replace advice given to you by your health care provider. Make sure you discuss any questions you have with your health care provider.   Document Released: 08/01/2009 Document Revised: 10/09/2013 Document Reviewed: 06/08/2013 Elsevier Interactive Patient Education 2016 [place].  Nothing to eat or drink until   1:30  pm today 04/06/2016

## 2016-04-06 NOTE — Telephone Encounter (Signed)
Pt states the zantac is not helping and she is coughing all night long. States when she was taking the prilosec daily the cough was better but she was still having problems with heartburn. Pt is going to stop the zantac and try taking prilosec 20mg  BID to see if this works for her. Pt wanted to know if she needs to come in for another visit. Please advise.

## 2016-04-06 NOTE — Op Note (Addendum)
[facility] Cardiopulmonary Patient Name: Natasha Fowler Procedure Date: 04/06/2016 MRN: [id number] Attending MD: Juanito Doom , MD Date of Birth: 1961-03-13 CSN: [id number] Age: 55 Admit Type: Outpatient Ethnicity: Not Hispanic or Latino Procedure:            Bronchoscopy Indications:          Chronic cough with abnormal CT Providers:            Ronie Spies. Kalvyn Desa, MD, Andre Lefort RRT,RCP, Cherre Huger RRT, RCP Referring MD:          Medicines:            Lidocaine 1% applied to cords 9 mL, Midazolam 4 mg IV,                        Fentanyl 0000000 mcg IV Complications:        No immediate complications Estimated Blood Loss: Estimated blood loss was minimal. Procedure:      Pre-Anesthesia Assessment:      - A History and Physical has been performed. Patient meds and allergies       have been reviewed. The risks and benefits of the procedure and the       sedation options and risks were discussed with the patient. All       questions were answered and informed consent was obtained. Patient       identification and proposed procedure were verified prior to the       procedure by the physician and the technician in the procedure room.       Mental Status Examination: alert and oriented. Airway Examination:       normal oropharyngeal airway. Respiratory Examination: clear to       auscultation. CV Examination: normal. ASA Grade Assessment: II - A       patient with mild systemic disease. After reviewing the risks and       benefits, the patient was deemed in satisfactory condition to undergo       the procedure. The anesthesia plan was to use moderate sedation /       analgesia (conscious sedation). Immediately prior to administration of       medications, the patient was re-assessed for adequacy to receive       sedatives. The heart rate, respiratory rate, oxygen saturations, blood       pressure, adequacy of pulmonary ventilation, and  response to care were       monitored throughout the procedure. The physical status of the patient       was re-assessed after the procedure.      After obtaining informed consent, the bronchoscope was passed under       direct vision. Throughout the procedure, the patient's blood pressure,       pulse, and oxygen saturations were monitored continuously. the [id number]       [id number]) scope was introduced through the right nostril and advanced to       the tracheobronchial tree. The procedure was accomplished without       difficulty. The patient tolerated the procedure well. The total duration       of the procedure was 9 minutes. Findings:      The larynx was notable for immobility of the left vocal cord, the right  moved normally. The tracheobronchial tree was normal in appearance       without lesion, mass or secretions.      Bronchoalveolar lavage was performed in the right middle lobe of the       lung and sent for cell count, bacterial culture, viral smears & culture,       and fungal & AFB analysis and cytology and flow cytometry. 60 mL of       fluid were instilled. 35 mL were returned. The return was cloudy. There       were no mucoid plugs in the return fluid.      Transbronchial biopsies of an area of infiltration were performed in the       anterior segment of the right upper lobe using forceps and sent for       histopathology examination. The procedure was guided by fluoroscopy.       Biopsy of lung tissue was obtained. Six biopsy passes were performed.       Six biopsy samples were obtained.      Estimated blood loss: minimal. Impression:      - Left vocal cord paralysis      - Chronic cough with abnormal CT      - Bronchoalveolar lavage was performed.      - Transbronchial lung biopsies were performed. Moderate Sedation:      Moderate (conscious) sedation was personally administered by the       endoscopist. The following parameters were monitored: oxygen  saturation,       heart rate, blood pressure, and response to care. Total physician       intraservice time was 14 minutes. Recommendation:      - Await BAL, biopsy, culture, cytology and washing results. Procedure Code(s):      --- Professional ---      [phone], Bronchoscopy, rigid or flexible, including fluoroscopic guidance,       when performed; with transbronchial lung biopsy(s), single lobe      [id number], Bronchoscopy, rigid or flexible, including fluoroscopic guidance,       when performed; with bronchial alveolar lavage      99152, Moderate sedation services provided by the same physician or       other qualified health care professional performing the diagnostic or       therapeutic service that the sedation supports, requiring the presence       of an independent trained observer to assist in the monitoring of the       patient's level of consciousness and physiological status; initial 15       minutes of intraservice time, patient age 45 years or older Diagnosis Code(s):      --- Professional ---      R05, Cough      R91.8, Other nonspecific abnormal finding of lung field CPT copyright 2016 American Medical Association. All rights reserved. The codes documented in this report are preliminary and upon coder review may  be revised to meet current compliance requirements. Norlene Campbell, MD Juanito Doom, MD 04/06/2016 12:01:06 PM This report has been signed electronically. Number of Addenda: 0 Scope In: 11:35:31 AM Scope Out: 11:44:37 AM

## 2016-04-06 NOTE — H&P (Signed)
  [facility] P[facility]M  HPI: Ms. Stika saw me a month ago for cough and has an abnormal CT chest that looks like sarcoidosis but she has never had a biopsy.  She is feeling OK, but her cough has been worse lately.  Past Medical History  Diagnosis Date  . Anxiety   . Arthritis   . GERD (gastroesophageal reflux disease)   . Hyperlipidemia   . Colon polyp      Family History  Problem Relation Age of Onset  . Colon cancer Maternal Grandmother     in her 19's  . Colon polyps Mother   . Heart disease Father      Social History   Social History  . Marital Status: Married    Spouse Name: N/A  . Number of Children: 1  . Years of Education: N/A   Occupational History  . general contractor    Social History Main Topics  . Smoking status: Never Smoker   . Smokeless tobacco: Never Used  . Alcohol Use: 0.0 oz/week    0 Standard drinks or equivalent per week     Comment: occ.  . Drug Use: No  . Sexual Activity: Not on file   Other Topics Concern  . Not on file   Social History Narrative     Allergies  Allergen Reactions  . Penicillins Anaphylaxis    Throat swelling  . Asa [Aspirin] Other (See Comments)    GI upset  . Codeine Hives  . Sulfa Antibiotics Hives     @encmedstart @  Filed Vitals:   04/06/16 1024 04/06/16 1057  BP:  103/67  Temp: 98.4 F (36.9 C)   Resp:  13  Height: 5\' 7"  (1.702 m)   Weight: 123 lb (55.792 kg)   SpO2:  100%   RA  Gen: well appearing HENT: OP clear, neck supple PULM: CTA B, normal percussion CV: RRR, no mgr, trace edema GI: BS+, soft, nontender Derm: no cyanosis or rash Psyche: normal mood and affect  CT chest > upperlobe predominant nodules bilaterally  Impression/Plan: Abnormal CT chest, likely sarcoidosis> plan bronchoscopy with biopsy today  Roselie Awkward, MD [place] PCCM Pager: [phone] Cell: [phone] After 3pm or if no response, call [phone]

## 2016-04-06 NOTE — Telephone Encounter (Signed)
Pt scheduled to see Dr. Hilarie Fredrickson 06/08/16@3pm . Appt letter mailed to pt.

## 2016-04-06 NOTE — Progress Notes (Signed)
Video Bronchoscopy done Intervention Bronchial washing Intervention Bronchial biopsy 

## 2016-04-06 NOTE — Telephone Encounter (Signed)
Next available?

## 2016-04-07 ENCOUNTER — Telehealth: Payer: Self-pay | Admitting: Pulmonary Disease

## 2016-04-07 DIAGNOSIS — J3800 Paralysis of vocal cords and larynx, unspecified: Secondary | ICD-10-CM

## 2016-04-07 LAB — ACID FAST SMEAR (AFB): ACID FAST SMEAR - AFSCU2: NEGATIVE

## 2016-04-07 LAB — ACID FAST SMEAR (AFB, MYCOBACTERIA)

## 2016-04-07 NOTE — Telephone Encounter (Signed)
Pt is wanting to know the name of the ENT that BQ recommended at her OV.  BQ - please advise. Thanks.

## 2016-04-07 NOTE — Telephone Encounter (Signed)
LVM for pt to return call

## 2016-04-07 NOTE — Telephone Encounter (Signed)
Patient states that she has paralyzed vocal chords, she says that this is making it hard for her to breath as well as talk.  She is very raspy sounding on the phone.  She said that she does not want to see Dr. Laurance Flatten again.  She said that Dr. Lake Bells mentioned another provider at the last OV and she wants to see that provider.  She said that she needs to be seen ASAP and needs the referral to be put in as URGENT because she has to take care of her mother and she cannot talk.    Dr. Lake Bells, please advise.

## 2016-04-07 NOTE — Telephone Encounter (Signed)
Pt called again, other message closed opened new one.Natasha Fowler

## 2016-04-07 NOTE — Telephone Encounter (Signed)
I called Natasha Fowler to let her know that the results of her bronchoscopy showed sarcoidosis, which is what we suspected all along.    However I had to leave a message.  She should see Dr. Laurance Flatten with ENT again (she said she had seen him in the past).  If this is mistaken, then I recommend that she see any of our ENT physicians in [place].

## 2016-04-08 ENCOUNTER — Encounter (HOSPITAL_COMMUNITY): Payer: Self-pay | Admitting: Pulmonary Disease

## 2016-04-08 LAB — CULTURE, RESPIRATORY W GRAM STAIN
Culture: NORMAL
Special Requests: NORMAL

## 2016-04-08 LAB — CULTURE, RESPIRATORY

## 2016-04-08 NOTE — Telephone Encounter (Signed)
Message was sent to [facility] yesterday. Will forward to [place] as well for Follow up.

## 2016-04-08 NOTE — Telephone Encounter (Signed)
Pt spoke to [place] and we gave her names and phone numbers to all the docs listed ok to close per [facility]

## 2016-04-08 NOTE — Telephone Encounter (Signed)
Carolin Coy, Benjamine Mola

## 2016-04-08 NOTE — Telephone Encounter (Signed)
Please have dr Lake Bells give her the name of the ENT doc that he told her about. She has called several times and just needs the name of a good ent and not dr Laurance Flatten.

## 2016-04-13 ENCOUNTER — Telehealth: Payer: Self-pay | Admitting: Pulmonary Disease

## 2016-04-13 NOTE — Telephone Encounter (Signed)
Called spoke with Anderson Malta with Dr. Pollie Friar office. I informed her that [place] at this [place]. She states that she will make Dr. Lucia Gaskins aware and if he has any more questions she will return our call. She voiced understanding and had no further questions.

## 2016-04-14 ENCOUNTER — Other Ambulatory Visit: Payer: Self-pay | Admitting: Otolaryngology

## 2016-04-14 DIAGNOSIS — R490 Dysphonia: Secondary | ICD-10-CM

## 2016-04-19 ENCOUNTER — Telehealth: Payer: Self-pay | Admitting: Pulmonary Disease

## 2016-04-19 DIAGNOSIS — J17 Pneumonia in diseases classified elsewhere: Principal | ICD-10-CM

## 2016-04-19 DIAGNOSIS — J168 Pneumonia due to other specified infectious organisms: Secondary | ICD-10-CM

## 2016-04-19 DIAGNOSIS — B49 Unspecified mycosis: Secondary | ICD-10-CM

## 2016-04-19 NOTE — Telephone Encounter (Signed)
I called Natasha Fowler to discuss the result of her BAL which grew Rhodotorula mucilaginosa  I think this is likely a contaminant, but anytime we assess folks for sarcoidosis we need to rule out atypical infections like this.  So I want her to see infectious disease, consult placed.  I had to leave a message explaining all this.  Have asked her to call back so triage can explain this.  Roselie Awkward, MD [place] PCCM Pager: [phone] Cell: [phone] After 3pm or if no response, call [phone]

## 2016-04-19 NOTE — Telephone Encounter (Signed)
Pt is calling for dr.mcquaid per his request. She isnt quite sure why, but she is calling him back

## 2016-04-21 ENCOUNTER — Telehealth: Payer: Self-pay | Admitting: Internal Medicine

## 2016-04-21 ENCOUNTER — Ambulatory Visit
Admission: RE | Admit: 2016-04-21 | Discharge: 2016-04-21 | Disposition: A | Discharge status: Home / Self Care | Payer: Commercial Managed Care - PPO | Source: Ambulatory Visit | Attending: Otolaryngology | Admitting: Otolaryngology

## 2016-04-21 ENCOUNTER — Telehealth: Payer: Self-pay | Admitting: Pulmonary Disease

## 2016-04-21 DIAGNOSIS — R490 Dysphonia: Secondary | ICD-10-CM

## 2016-04-21 MED ORDER — IOPAMIDOL (ISOVUE-300) INJECTION 61%
75.0000 mL | Freq: Once | INTRAVENOUS | Status: AC | PRN
  Administered 2016-04-21: 75 mL via INTRAVENOUS

## 2016-04-21 NOTE — Telephone Encounter (Signed)
Pt is returning call to see who dr Lake Bells wants to refer her to. Michela Pitcher he called her before the holiday.  [phone]

## 2016-04-21 NOTE — Telephone Encounter (Signed)
Spoke with pt and she was wanting to know who BQ had referred her to. Per chart he sent referral request to [facility] and it is under review. Advised pt that they should be contacting her in the next few days to schedule. Nothing further needed.

## 2016-04-21 NOTE — Telephone Encounter (Signed)
Pt reports her reflux/heartburn is terrible. States she is losing weight and cannot wait until August. Offered pt an appt for tomorrow and she states she has 2 other appts and cannot make it. Pt scheduled to see Alonza Bogus PA 04/26/16@2 :30pm. Pt aware of appt.

## 2016-04-22 ENCOUNTER — Encounter: Payer: Self-pay | Admitting: Pulmonary Disease

## 2016-04-22 ENCOUNTER — Ambulatory Visit (INDEPENDENT_AMBULATORY_CARE_PROVIDER_SITE_OTHER): Payer: Commercial Managed Care - PPO | Admitting: Pulmonary Disease

## 2016-04-22 VITALS — BP 100/64 | HR 84 | Ht 67.0 in | Wt 124.2 lb

## 2016-04-22 DIAGNOSIS — I729 Aneurysm of unspecified site: Secondary | ICD-10-CM

## 2016-04-22 DIAGNOSIS — R05 Cough: Secondary | ICD-10-CM | POA: Diagnosis not present

## 2016-04-22 DIAGNOSIS — R059 Cough, unspecified: Secondary | ICD-10-CM

## 2016-04-22 DIAGNOSIS — I712 Thoracic aortic aneurysm, without rupture, unspecified: Secondary | ICD-10-CM

## 2016-04-22 DIAGNOSIS — D860 Sarcoidosis of lung: Secondary | ICD-10-CM | POA: Diagnosis not present

## 2016-04-22 NOTE — Progress Notes (Signed)
Subjective:    Patient ID: Natasha Fowler, female    DOB: 02-06-1961, 55 y.o.   MRN: [id number]  Synopsis: Kilie Melesio had a transbronchial biopsy in June 2017 showing noncaseating granulomas. The BAL grew out a yeast. She also was noted to have vocal cord paralysis during that endoscopy.  HPI Chief Complaint  Patient presents with  . Follow-up    pt had ct and bronch since last ov- c/o hoarseness, nonprod cough.     Joelene Millin saw ENT after our bronchoscopy.  She still has vocal cord paralysis.  She is seeing Dr. Lucia Gaskins for this.  She had a CT scan of the neck.  She continues to struggle with severe heartburn on a daily basis.  She has cut out caffeine, alcohol, fatty foods and she has lost weight.  She is taking prilosec twice a day.  Taking the prilosec twice a day helps with her cough associated with "strangling on my reflux fluid".    She continues to struggle with dyspnea with exertion.  This is really worse when she is talking and multi-tasking. She can walk a mile at a time without severe dyspnea however.  Carrying groceries will.     Past Medical History  Diagnosis Date  . Anxiety   . Arthritis   . GERD (gastroesophageal reflux disease)   . Hyperlipidemia   . Colon polyp   . Vocal cord paralysis, unilateral complete 04/06/2016    left, noted on bronchoscopy      Review of Systems  Constitutional: Negative for fever, chills and fatigue.  HENT: Negative for rhinorrhea, sinus pressure and sneezing.   Respiratory: Positive for cough and shortness of breath. Negative for wheezing.   Cardiovascular: Negative for chest pain, palpitations and leg swelling.       Objective:   Physical Exam  Filed Vitals:   04/22/16 1506  BP: 100/64  Pulse: 84  Height: 5\' 7"  (1.702 m)  Weight: 124 lb 3.2 oz (56.337 kg)  SpO2: 100%  RA  Gen: well appearing HENT: OP clear, TM's clear, neck supple PULM: CTA B, normal percussion CV: RRR, no mgr, trace edema GI: BS+, soft,  nontender Derm: no cyanosis or rash Psyche: normal mood and affect    04/06/2016 Transbronchial biopsy > non-caseating granulomas 04/06/2016 BAL> Rhodotorula mucilaginosa  2015 CT chest images personally reviewed showing scattered, spiculated appearing pulmonary nodules in a bronchovascular distribution with some central bronchiectasis. 2015 [facility] (Dr. Gwenyth Allegra) evaluated the patient and recommended a bronchoscopy. She refused. She was treated with Advair. Serologies including serum ace, Anka, ANA, QuantiFERON goal, and hypersensitivity pneumonitis panel were reportedly normal. 02/2016 CT chest> progression of bronchovascular nodularity and calcified mediastinal lymphadenopathy     Assessment & Plan:  Paralyzed vocal cords Continue follow-up with her nose and throat.  This is contributing to her cough to some degree in the setting of reflux. If she does not have resolution of her vocal cord paralysis then she may need medialization.  Cough Comment K problem due to worsening acid reflux and vocal cord paralysis. Certainly the granulomatous lung disease is not helping, but I think that the acid reflux is that primary cause considering the fact that her cough has improved with increased antiacid therapy.  Plan: Continue follow-up with ENT for vocal cord paralysis Continue follow-up with gastroenterology for gastroesophageal reflux disease   Pulmonary sarcoidosis ([place]) At this time it looks like him has pulmonary sarcoidosis. However, she grew yeast from her bronchoalveolar lavage with which I'm not familiar.  Before we give her a formal diagnosis of sarcoid would need to make sure that this is not a true infection. I doubt it.  Plan: Refer to infectious diseases to evaluate this used Annual ophtho evaluations Hold off on steroid treatment for two reasons: I doubt it will improve her symptoms of cough, and to a don't want to suppress her immune system until she's been seen  by infectious diseases to evaluate this yeast. Because she has been having some dyspnea going to check for pulmonary function testing compare it to the one performed 2 years ago at [facility].   Thoracic aortic aneurysm [facility]) This was seen on her CT chest. I'll refer to thoracic surgery for further evaluation.     Current outpatient prescriptions:  .  estradiol-norethindrone (COMBIPATCH) 0.05-0.14 MG/DAY, Place 1 patch onto the skin once a week., Disp: , Rfl:  .  omeprazole (PRILOSEC) 20 MG capsule, Take 1 capsule (20 mg total) by mouth daily. Brand name prilosec only, Disp: 30 capsule, Rfl: 2

## 2016-04-22 NOTE — Patient Instructions (Signed)
We will refer you to infectious diseases We will refer you to thoracic surgery Follow-up with her nose and throat Follow-up with gastroenterology We will perform a lung function test We will see you back in 6 weeks

## 2016-04-22 NOTE — Assessment & Plan Note (Signed)
This was seen on her CT chest. I'll refer to thoracic surgery for further evaluation.

## 2016-04-22 NOTE — Assessment & Plan Note (Signed)
Comment K problem due to worsening acid reflux and vocal cord paralysis. Certainly the granulomatous lung disease is not helping, but I think that the acid reflux is that primary cause considering the fact that her cough has improved with increased antiacid therapy.  Plan: Continue follow-up with ENT for vocal cord paralysis Continue follow-up with gastroenterology for gastroesophageal reflux disease

## 2016-04-22 NOTE — Assessment & Plan Note (Addendum)
At this time it looks like him has pulmonary sarcoidosis. However, she grew yeast from her bronchoalveolar lavage with which I'm not familiar. Before we give her a formal diagnosis of sarcoid would need to make sure that this is not a true infection. I doubt it.  Plan: Refer to infectious diseases to evaluate this used Annual ophtho evaluations Hold off on steroid treatment for two reasons: I doubt it will improve her symptoms of cough, and to a don't want to suppress her immune system until she's been seen by infectious diseases to evaluate this yeast. Because she has been having some dyspnea going to check for pulmonary function testing compare it to the one performed 2 years ago at [facility].

## 2016-04-22 NOTE — Assessment & Plan Note (Signed)
Continue follow-up with her nose and throat.  This is contributing to her cough to some degree in the setting of reflux. If she does not have resolution of her vocal cord paralysis then she may need medialization.

## 2016-04-26 ENCOUNTER — Ambulatory Visit: Payer: PRIVATE HEALTH INSURANCE | Admitting: Gastroenterology

## 2016-05-04 ENCOUNTER — Ambulatory Visit: Payer: Commercial Managed Care - PPO | Admitting: Internal Medicine

## 2016-05-04 ENCOUNTER — Telehealth: Payer: Self-pay | Admitting: Internal Medicine

## 2016-05-04 NOTE — Telephone Encounter (Signed)
Pt states she is having lots of problems with reflux and is tired of the pain. Pt states medication is not working and she thinks she may need to have some sort of testing done. Pt scheduled to see Alonza Bogus PA 05/05/16@2pm . Pt aware of appt.

## 2016-05-04 NOTE — Telephone Encounter (Signed)
Left message for pt to call back  °

## 2016-05-05 ENCOUNTER — Encounter: Payer: Self-pay | Admitting: Gastroenterology

## 2016-05-05 ENCOUNTER — Encounter: Payer: PRIVATE HEALTH INSURANCE | Admitting: Cardiothoracic Surgery

## 2016-05-05 ENCOUNTER — Ambulatory Visit (INDEPENDENT_AMBULATORY_CARE_PROVIDER_SITE_OTHER): Payer: Commercial Managed Care - PPO | Admitting: Gastroenterology

## 2016-05-05 VITALS — BP 110/70 | HR 76 | Ht 67.0 in | Wt 123.8 lb

## 2016-05-05 DIAGNOSIS — K219 Gastro-esophageal reflux disease without esophagitis: Secondary | ICD-10-CM

## 2016-05-05 DIAGNOSIS — R1013 Epigastric pain: Secondary | ICD-10-CM

## 2016-05-05 NOTE — Patient Instructions (Addendum)
You have been scheduled for an abdominal ultrasound at [facility] (1st floor of hospital) on 05-13-2016 Thursday at 8:00 am. Please arrive at 7:45 am to your appointment for registration. Make certain not to have anything to eat or drink after midnight.Should you need to reschedule your appointment, please contact radiology at [phone]. This test typically takes about 30 minutes to perform.  We scheduled the 24 HR PH Probe test at [facility] , 1st floor. Date is 05-17-2016 Monday. Arrive at 8:00 am. Have nothing buy mouth after midnight.

## 2016-05-07 ENCOUNTER — Ambulatory Visit (HOSPITAL_COMMUNITY): Payer: Commercial Managed Care - PPO

## 2016-05-11 ENCOUNTER — Institutional Professional Consult (permissible substitution) (INDEPENDENT_AMBULATORY_CARE_PROVIDER_SITE_OTHER): Payer: Commercial Managed Care - PPO | Admitting: Cardiothoracic Surgery

## 2016-05-11 ENCOUNTER — Encounter: Payer: Self-pay | Admitting: Cardiothoracic Surgery

## 2016-05-11 VITALS — BP 121/72 | HR 77 | Resp 16 | Ht 67.0 in | Wt 123.0 lb

## 2016-05-11 DIAGNOSIS — I712 Thoracic aortic aneurysm, without rupture, unspecified: Secondary | ICD-10-CM

## 2016-05-11 LAB — FUNGUS CULTURE RESULT

## 2016-05-11 LAB — FUNGUS CULTURE WITH STAIN

## 2016-05-11 LAB — FUNGAL ORGANISM REFLEX

## 2016-05-11 NOTE — Progress Notes (Signed)
PCP is CABEZA,YURI, MD Referring Provider is Juanito Doom, MD  Chief Complaint  Patient presents with  . 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 The patient is a never smoker and does not have hypertension. There is no known family history of thoracic or abdominal aortic aneurysmal disease. There is no family history of aortic dissection or sudden death suggestive of aortic dissection. The patient's father was a heavy smoker and did have serious cardiac disease including cardiac surgery which was performed out of town and details are not available. The patient has mild hyperlipidemia with total cholesterol up to 230.  The patient has had epigastric pain compared to a clenched fist in her upper abdomen. She's been treated for reflux. She has been evaluated by gastroenterology and is undergoing further evaluation including pH monitoring and gallbladder studies. Upper endoscopy was performed and was unremarkable. Patient also has mid thoracic back pain. This is not temporally  related to exercise or exertion. She denies upper substernal pain.  Approximately 2 years ago the patient had CT scan of the chest which is reported--the ascending aorta at that time measured 4.1 cm. Images not available. In May 2017 the noncontrast CT scan of the chest measured the ascending aorta at 4.3 cm. However this is a noncontrasted study and the imaging of the aorta is suboptimal.  The patient's hoarseness has progressed over the past 1-2 years. CT of the neck with contrast was performed  by ENT which showed no abnormalities. CT scan of the chest shows calcified lymph nodes in the AP window related to the sarcoidosis which could potentially lie in the region of the left  recurrent laryngeal nerve  Sarcoidosis was documented by transbronchial biopsy showing noncaseating granuloma. PFTs are pending. The patient states her pulmonologist feels the sarcoidosis is mild and does not need therapy currently.  Past Medical History:  Diagnosis Date  . Anxiety   . Arthritis   . Colon polyp   . GERD (gastroesophageal reflux disease)   . Hyperlipidemia   . Vocal cord paralysis, unilateral complete 04/06/2016   left, noted on bronchoscopy    Past Surgical History:  Procedure Laterality Date  . TONSILLECTOMY     age 55 , have grown back per Dr  . Tamsen Roers BRONCHOSCOPY Bilateral 04/06/2016   Procedure: VIDEO BRONCHOSCOPY WITH FLUORO;  Surgeon: Juanito Doom, MD;  Location: [facility];  Service: Cardiopulmonary;  Laterality: Bilateral;    Family History  Problem Relation Age of Onset  . Colon cancer Maternal Grandmother     in her 5's  . Colon polyps Mother   . Heart disease Father     Social History Social History  Substance Use Topics  . Smoking status: Never Smoker  . Smokeless tobacco: Never Used  . Alcohol use No    Current Outpatient Prescriptions  Medication Sig Dispense Refill  . estradiol-norethindrone (COMBIPATCH) 0.05-0.14 MG/DAY Place 1 patch onto the skin once a week.    Marland Kitchen omeprazole (PRILOSEC) 20 MG capsule Take 1 capsule (20 mg total) by mouth daily. Brand name prilosec only 30 capsule 2  No current facility-administered medications for this visit.     Allergies  Allergen Reactions  . Penicillins Anaphylaxis    Throat swelling  . Asa [Aspirin] Other (See Comments)    GI upset  . Codeine Hives  . Sulfa Antibiotics Hives    Review of Systems        Review of Systems :  [ y ] = yes, [  ] = no        General :  Weight gain [   ]    Weight loss   [  yes 10-15 pounds ]  Fatigue [  ]  Fever [  ]  Chills  [  ]                                Weakness  [  ]           HEENT    Headache [  ]  Dizziness [  ]  Blurred vision [yes status post Lasik surgery  ] Glaucoma  [  ]                          Nosebleeds [  ] Painful or loose teeth [  ]        Cardiac :  Chest pain/ pressure [ yes epigastric ]  Resting SOB [  ] exertional SOB [  ]                        Orthopnea [  ]  Pedal edema  [  ]  Palpitations [  ] Syncope/presyncope [ ]                         Paroxysmal nocturnal dyspnea [  ]         Pulmonary : cough [he has nonproductive  ]  wheezing [  ]  Hemoptysis [  ] Sputum [  ] Snoring [  ]                              Pneumothorax [  ]  Sleep apnea [  ]        GI : Vomiting [  ]  Dysphagia [  ]  Melena  [  ]  Abdominal pain [  ] BRBPR [  ]              Heart burn [ yes ]  Constipation [  ] Diarrhea  [  ] Colonoscopy [   ]        GU : Hematuria [  ]  Dysuria [  ]  Nocturia [  ] UTI's [  ]        Vascular : Claudication [  ]  Rest pain [  ]  DVT [  ] Vein stripping [  ] leg ulcers [  ]                          TIA [  ] Stroke [  ]  Varicose veins [  ]        NEURO :  Headaches  [  ] Seizures [  ] Vision changes [yes status post Abril.Chestnut  ] Paresthesias [  ]  Seizures [  ]        Musculoskeletal :  Arthritis [  ] Gout  [  ]  Back pain [  yes  Joint pain [  ]        Skin :  Rash [  ]  Melanoma [  ] Sores [  ]        Heme : Bleeding problems [  ]Clotting Disorders [  ] Anemia [  ]Blood Transfusion [ ]         Endocrine : Diabetes [  ] Heat or Cold intolerance [  ] Polyuria [  ]excessive thirst [ ]         Psych : Depression [  ]  Anxiety [  ]  Psych hospitalizations [  ] Memory change [  ]     Patient's 51 year old son died 2 years ago.                                          BP 121/72   Pulse 77   Resp 16   Ht 5\' 7"  (1.702 m)   Wt 123 lb (55.8 kg)   SpO2 99% Comment: ON RA  BMI 19.26 kg/m   Physical Exam      Physical Exam  General: Healthy but thin-appearing middle-aged Caucasian female no acute distress HEENT: Normocephalic pupils equal , dentition adequate Neck: Supple without JVD, adenopathy, or bruit Chest: Clear to auscultation, symmetrical breath sounds, no rhonchi, no tenderness             or deformity Cardiovascular: Regular rate and rhythm, no murmur, no gallop, peripheral pulses             palpable in all extremities Abdomen:  Soft,, mild epigastric tenderness, no palpable mass or organomegaly Extremities: Warm, well-perfused, no clubbing cyanosis edema or tenderness,              no venous stasis changes of the legs Rectal/GU: Deferred Neuro: Grossly non--focal and symmetrical throughout Skin: Clean and dry without rash or ulceration   Diagnostic Tests: CT scan of chest images reviewed and fusiform ascending aneurysm 4.2 cm diameter is noted. No evidence of mural hematoma or endotracheal ulceration.  Impression: Her ascending aorta has a mild fusiform aneurysm This is at little or no risk for dissection at its current size. Surgery typically is not recommended until the diameter reaches 5.5 cm. Because she is a nonsmoker and does not have hypertension there is little risk  this will be a potential problem -she does not appear to have a connective tissue disorder. We will continue to monitor her thoracic aorta with surveillance scans. She will return in 6 months with a CTA of her aorta. Prior to that time the patient will have a transthoracic echocardiogram because of her atypical chest pain to evaluate possible mitral prolapse and assess her aortic root.  Plan: Echocardiogram          CTA chest for surveillance of her mildly dilated ascending aorta with return office visit   Len Childs, MD [facility] [phone]

## 2016-05-12 ENCOUNTER — Encounter: Payer: Self-pay | Admitting: Gastroenterology

## 2016-05-12 ENCOUNTER — Other Ambulatory Visit: Payer: Self-pay | Admitting: *Deleted

## 2016-05-12 DIAGNOSIS — R1013 Epigastric pain: Secondary | ICD-10-CM | POA: Insufficient documentation

## 2016-05-12 DIAGNOSIS — R079 Chest pain, unspecified: Secondary | ICD-10-CM

## 2016-05-12 DIAGNOSIS — K219 Gastro-esophageal reflux disease without esophagitis: Secondary | ICD-10-CM | POA: Insufficient documentation

## 2016-05-12 NOTE — Progress Notes (Addendum)
     05/05/2016 Natasha Fowler [id number] Nov 22, 1960   History of Present Illness:  This is a 55 year old female who was seen by Dr. Hilarie Fredrickson in May for GERD related symptoms.  Please see his note from 02/18/2016 for further details regarding her history.  She presents to our office today with similar symptoms but focuses more on pain that she describes to be under her shoulder blades and under her diaphragm/breasts after she eats.  This has been present for several months and has become constant and very bothersome.  Has a lot of belching as well.  Has been on several PPI's without success and says that prilosec seems to be the best, but is not working very well.  Denies any dysphagia.  Says that she has been doing everything lifestyle/dietary wise for her GERD with no improvements.  Says that her hoarseness actually ended up being a paralyzed vocal cord.  She is seeing a CT surgery next week for thoracic aortic aneurysm and is seeing infectious disease to help rule out pulmonary fungal infection that was suggested on bronchoscopy when she had this done with pulmonary.    Colonoscopy dated 09/17/2013 -- diminutive polyp at the appendiceal orifice removed with cold biopsy. Pathology = normal colonic mucosa. Otherwise normal exam Upper endoscopy performed 12/18/2013 -- regular and crisp Z line with nonobstructing esophageal ring at GE junction.  Feline appearance in esophageal mucosa which was biopsied. Hiatal hernia. Gastric erythema which was biopsied. Diminutive gastric polyp biopsied. Normal duodenum. Pathology = benign gastric mucosa with mild chronic gastritis. Negative for H. pylori. Gastric polyp consistent with fundic gland polyp. Esophageal biopsies benign squamous mucosa with no significant inflammation. No evidence of EoE   Current Medications, Allergies, Past Medical History, Past Surgical History, Family History and Social History were reviewed in [employer]  record.   Physical Exam: BP 110/70   Pulse 76   Ht 5\' 7"  (1.702 m)   Wt 123 lb 12.8 oz (56.2 kg)   BMI 19.39 kg/m  General: Well developed white female in no acute distress; anxious Head: Normocephalic and atraumatic Eyes:  Sclerae anicteric, conjunctiva pink  Ears: Normal auditory acuity Lungs: Clear throughout to auscultation Heart: Regular rate and rhythm Abdomen: Soft, non-distended.  Normal bowel sounds.  Mild epigastric TTP. Musculoskeletal: Symmetrical with no gross deformities  Extremities: No edema  Neurological: Alert oriented x 4, grossly non-focal Psychological:  Alert and cooperative. Normal mood and affect  Assessment and Recommendations: -55 year old female with GERD who describes epigastric abdominal pain and pain between her shoulder blades that is extremely bothersome to her over the past several months.  Also has a lot of belching.  She is very anxious and I wonder if this is causing or at least exacerbating her symptoms.  She is on PPI and has been for quite some time.  ? If she has uncontrolled GERD vs esophageal spasm vs gallbladder related issue.  Will start with abdominal ultrasound and 24 pH study while she is on her PPI.  ? If she will need a manometry as well.   -Vocal cord paralysis -Thoracic aortic aneurysm -Pulmonary sarcoidosis  Addendum: Reviewed and agree with management. Would proceed with 24-hour pH/impedance testing plus manometry. Agree with performing study on PPI to see if this is a true GERD issue or not. Jerene Bears, MD

## 2016-05-13 ENCOUNTER — Ambulatory Visit (HOSPITAL_COMMUNITY): Admission: RE | Admit: 2016-05-13 | Payer: Commercial Managed Care - PPO | Source: Ambulatory Visit

## 2016-05-17 ENCOUNTER — Encounter (HOSPITAL_COMMUNITY): Admission: RE | Payer: Self-pay | Source: Ambulatory Visit

## 2016-05-17 ENCOUNTER — Ambulatory Visit (HOSPITAL_COMMUNITY)
Admission: RE | Admit: 2016-05-17 | Payer: Commercial Managed Care - PPO | Source: Ambulatory Visit | Admitting: Internal Medicine

## 2016-05-17 SURGERY — MANOMETRY, ESOPHAGUS

## 2016-05-20 LAB — ACID FAST CULTURE WITH REFLEXED SENSITIVITIES (MYCOBACTERIA)

## 2016-05-20 LAB — ACID FAST CULTURE WITH REFLEXED SENSITIVITIES: ACID FAST CULTURE - AFSCU3: NEGATIVE

## 2016-05-25 ENCOUNTER — Ambulatory Visit (HOSPITAL_COMMUNITY): Payer: Commercial Managed Care - PPO | Attending: Cardiology

## 2016-05-25 ENCOUNTER — Other Ambulatory Visit: Payer: Self-pay

## 2016-05-25 ENCOUNTER — Encounter (INDEPENDENT_AMBULATORY_CARE_PROVIDER_SITE_OTHER): Payer: Self-pay

## 2016-05-25 DIAGNOSIS — I358 Other nonrheumatic aortic valve disorders: Secondary | ICD-10-CM | POA: Insufficient documentation

## 2016-05-25 DIAGNOSIS — I5189 Other ill-defined heart diseases: Secondary | ICD-10-CM | POA: Insufficient documentation

## 2016-05-25 DIAGNOSIS — I351 Nonrheumatic aortic (valve) insufficiency: Secondary | ICD-10-CM | POA: Insufficient documentation

## 2016-05-25 DIAGNOSIS — I340 Nonrheumatic mitral (valve) insufficiency: Secondary | ICD-10-CM | POA: Insufficient documentation

## 2016-05-25 DIAGNOSIS — I341 Nonrheumatic mitral (valve) prolapse: Secondary | ICD-10-CM | POA: Diagnosis not present

## 2016-05-25 DIAGNOSIS — I77810 Thoracic aortic ectasia: Secondary | ICD-10-CM | POA: Diagnosis not present

## 2016-05-25 DIAGNOSIS — E785 Hyperlipidemia, unspecified: Secondary | ICD-10-CM | POA: Diagnosis not present

## 2016-05-25 DIAGNOSIS — R079 Chest pain, unspecified: Secondary | ICD-10-CM | POA: Insufficient documentation

## 2016-05-27 ENCOUNTER — Encounter: Payer: Self-pay | Admitting: *Deleted

## 2016-06-01 ENCOUNTER — Ambulatory Visit: Payer: PRIVATE HEALTH INSURANCE | Admitting: Internal Medicine

## 2016-06-08 ENCOUNTER — Ambulatory Visit (INDEPENDENT_AMBULATORY_CARE_PROVIDER_SITE_OTHER): Payer: Commercial Managed Care - PPO | Admitting: Internal Medicine

## 2016-06-08 ENCOUNTER — Encounter: Payer: Self-pay | Admitting: Internal Medicine

## 2016-06-08 VITALS — BP 112/77 | HR 75 | Temp 97.9°F | Wt 123.0 lb

## 2016-06-08 VITALS — BP 122/60 | HR 76 | Ht 67.0 in | Wt 124.2 lb

## 2016-06-08 DIAGNOSIS — R059 Cough, unspecified: Secondary | ICD-10-CM

## 2016-06-08 DIAGNOSIS — B49 Unspecified mycosis: Secondary | ICD-10-CM | POA: Diagnosis not present

## 2016-06-08 DIAGNOSIS — J17 Pneumonia in diseases classified elsewhere: Secondary | ICD-10-CM

## 2016-06-08 DIAGNOSIS — J168 Pneumonia due to other specified infectious organisms: Secondary | ICD-10-CM

## 2016-06-08 DIAGNOSIS — R05 Cough: Secondary | ICD-10-CM

## 2016-06-08 DIAGNOSIS — K219 Gastro-esophageal reflux disease without esophagitis: Secondary | ICD-10-CM

## 2016-06-08 MED ORDER — RANITIDINE HCL 75 MG PO TABS: 75.0000 mg | ORAL_TABLET | Freq: Two times a day (BID) | ORAL | 2 refills | Status: DC

## 2016-06-08 NOTE — Patient Instructions (Signed)
We have sent the following medications to your pharmacy for you to pick up at your convenience: Ranitidine 75 mg twice daily (in place of prilosec)  Please follow up with Dr Hilarie Fredrickson in 3-4 months.  If you are age 55 or older, your body mass index should be between 23-30. Your Body mass index is 19.45 kg/m. If this is out of the aforementioned range listed, please consider follow up with your Primary Care Provider.  If you are age 29 or younger, your body mass index should be between 19-25. Your Body mass index is 19.45 kg/m. If this is out of the aformentioned range listed, please consider follow up with your Primary Care Provider.

## 2016-06-08 NOTE — Patient Instructions (Signed)
I will call you back in next 7-10 working days with results from today's visit as well as track down micro data from your bronchoscopy. Based upon those study results, we will see if need to have to schedule another visit.

## 2016-06-08 NOTE — Progress Notes (Signed)
Subjective:    Patient ID: Natasha Fowler, female    DOB: 1961/03/06, 55 y.o.   MRN: [id number]  HPI Natasha Fowler is a 55 year old female with a history of GERD, chronic cough, thoracic aortic aneurysm, vocal cord paralysis, pulmonary sarcoidosis versus fungal pneumonia who is here for follow-up. She was last seen in the office on 05/05/2016 by Alonza Bogus, PA-C. At this time she was having more frequent pain under her shoulder blades and diaphragm after she eats. She also had belching. At that visit her PPI was continued and she was using omeprazole 20 mg twice a day. She was scheduled for 24-hour pH and impedance testing with manometry to evaluate her symptoms and to try to determine if these were secondary to esophageal spasm, uncontrolled reflux, etc. This study was canceled due to the need to care for her mother in [place], [place]. She also been evaluated by CT surgery, pulmonology, ENT and now infectious disease.  She reported that she developed that epigastric pressure and also in her mid back under her shoulder blades. This felt different to her and not true heartburn. She was using Gas-X and Gaviscon. She decided to reduce Prilosec to once per day than every other day now every 3 days. The epigastric pressure and pain along with upper back pain have completely resolved. She was also noticing loose stools with her twice a day PPI. Currently she's having no heartburn or epigastric pain or pressure. Her tickling cough is back. This definitely improved with PPI. Bowel movements a been regular recently without blood in her stool or melena.  Review of Systems As per history of present illness, otherwise negative  Current Medications, Allergies, Past Medical History, Past Surgical History, Family History and Social History were reviewed in [employer] record.     Objective:   Physical Exam BP 122/60 (BP Location: Left Arm, Patient Position: Sitting, Cuff  Size: Normal)   Pulse 76   Ht 5\' 7"  (1.702 m)   Wt 124 lb 3.2 oz (56.3 kg)   BMI 19.45 kg/m  Constitutional: Well-developed and well-nourished. No distress. HEENT: Normocephalic and atraumatic. Conjunctivae are normal.  No scleral icterus. Neck: Neck supple. Trachea midline. Cardiovascular: Normal rate, regular rhythm and intact distal pulses.  Pulmonary/chest: Effort normal and breath sounds normal. No wheezing, rales or rhonchi. Abdominal: Soft, nontender, nondistended. Bowel sounds active throughout.  Extremities: no clubbing, cyanosis, or edema Neurological: Alert and oriented to person place and time. Skin: Skin is warm and dry. Psychiatric: Normal mood and affect. Behavior is normal.  GI procedure history:  Colonoscopy dated 09/17/2013 -- diminutive polyp at the appendiceal orifice removed with cold biopsy. Pathology = normal colonic mucosa. Otherwise normal exam  Upper endoscopy performed 12/18/2013 -- regular and crisp Z line with nonobstructing esophageal ring at GE junction. Feline appearance in esophageal mucosa which was biopsied. Hiatal hernia. Gastric erythema which was biopsied. Diminutive gastric polyp biopsied. Normal duodenum. Pathology = benign gastric mucosa with mild chronic gastritis. Negative for H. pylori. Gastric polyp consistent with fundic gland polyp. Esophageal biopsies benign squamous mucosa with no significant inflammation. No evidence of EoE    Assessment & Plan:  55 year old female with a history of GERD, chronic cough, thoracic aortic aneurysm, vocal cord paralysis, pulmonary sarcoidosis versus fungal pneumonia who is here for follow-up  1. GERD/chronic cough -- Symptoms of heartburn and reflux have resolved despite taking omeprazole only every 3 days. The epigastric pressure may have been a medication side effect. I  will list this in her intolerances. Going to have her stop PPI altogether due to lack of response and use ranitidine 75 mg twice a day. Cough  improved with acid suppression and this is the reason for twice a day H2 blocker. Hoarseness secondary to vocal cord paralysis. No history of GERD. Will hold on 24-hour pH impedance testing at present given improvement in overall symptoms.  2. Vocal cord paralysis -- per ENT  3. Sarcoidosis versus fungal pneumonia -- pulmonary and ID follow-up. Tests pending from ID appointment today  4. Thoracic aneurysm -- will follow with CT surgery, Dr. Prescott Gum.  4-6 month ROV, sooner PRN 25 minutes spent with the patient today. Greater than 50% was spent in counseling and coordination of care with the patient

## 2016-06-08 NOTE — Progress Notes (Signed)
Patient ID: Natasha Fowler, female   DOB: 02-07-1961, 55 y.o.   MRN: [id number]  HPI Natasha Fowler is a 55yo F who is referred to ID clinic for unusal cx finding after undergoing bronchoscopy for what is thought to be progressive sarcoidosis. 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   55yrs ago not diligent at wearing mask, since 2015 more diligent, wears hepa filter mask with dust/mold. No abstesos removal.   Scapular pain x 2-3 months.   No fever, chills, but has nightsweats - she attributes to menopause.-that she has been cutting back.arried  - exhausted. Tire all the time. Slept unexpectedly.    Outpatient Encounter Prescriptions as of 06/08/2016  Medication Sig  . estradiol-norethindrone (COMBIPATCH) 0.05-0.14 MG/DAY Place 1 patch onto the skin once a week.  Marland Kitchen omeprazole (PRILOSEC) 20 MG capsule Take 1 capsule (20 mg total) by mouth daily. Brand name prilosec only (Patient not taking: Reported on 06/08/2016)   No facility-administered encounter medications on file as of 06/08/2016.      Patient Active Problem List   Diagnosis Date Noted  . Abdominal pain, epigastric 05/12/2016  . Gastroesophageal reflux disease without esophagitis 05/12/2016  . Thoracic aortic aneurysm ([place]) 04/22/2016  . Paralyzed vocal cords 04/07/2016  . Dyspnea 03/11/2016  . Pulmonary sarcoidosis ([place]) 03/11/2016  . Cough 03/11/2016  - osteoporosis of hip - osteopenia in spine - tonsillectomy at age of 2  Health Maintenance Due  Topic Date Due  . Hepatitis C Screening  Nov 09, 1960  . HIV Screening  11/16/1975  . TETANUS/TDAP  11/16/1979  . PAP SMEAR  11/15/1981  . MAMMOGRAM  11/07/2014  . INFLUENZA VACCINE  05/18/2016    Social History  Substance Use Topics  .  Smoking status: Never Smoker  . Smokeless tobacco: Never Used  . Alcohol use No  family history includes Colon cancer in her maternal grandmother; Colon polyps in her mother; Heart disease in her father. Review of Systems  Physical Exam   BP 112/77 (BP Location: Right Arm, Patient Position: Sitting, Cuff Size: Normal)   Pulse 75   Temp 97.9 F (36.6 C) (Oral)   Wt 123 lb (55.8 kg)   SpO2 99%   BMI 19.26 kg/m   Physical Exam  Constitutional:  oriented to person, place, and time. appears well-developed and well-nourished. No distress.  HENT: [place]/AT, PERRLA, no scleral icterus Mouth/Throat: Oropharynx is clear and moist. No oropharyngeal exudate.  Cardiovascular: Normal rate, regular rhythm and normal heart sounds. Exam reveals no gallop and no friction rub.  No murmur heard.  Pulmonary/Chest: Effort normal and breath sounds normal. No respiratory distress.  has no wheezes.  Neck = supple, no nuchal rigidity Abdominal: Soft. Bowel sounds are normal.  exhibits no distension. There is no tenderness.  Lymphadenopathy: no cervical adenopathy. No axillary adenopathy Neurological: alert and oriented to person, place, and time.  Skin: Skin is warm and dry. No rash noted. No erythema.  Psychiatric: a normal mood and affect.  behavior is normal.    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 She does not appear to have clinical history to suggest immunocompromised state.   Recommend to get total immunoglobulin and hiv ab. If normal, would go ahead start treatment for sarcoidosis.  Health maintenance = will check hep c  Jhordan [phone]: call with results of aspergillus negative, b-d glucan negative

## 2016-06-10 LAB — ASPERGILLUS ANTIGEN,SERUM
Aspergillus Ag, EIA: NOT DETECTED
Index Value: 0.16

## 2016-06-11 LAB — FUNGITELL (1-3)-B-D-GLUCAN
(1-3)-B-D-Glucan: 42 pg/mL
INTERPRETATION: NEGATIVE

## 2016-06-11 LAB — ASPERGILLUS ANTIBODY BY IMMUNODIFF: ASPERGILLUS ANTIBODY ID: NEGATIVE

## 2016-06-11 LAB — ASPERGILLUS ANTIBODY (COMPLEMENT FIX): Aspergillus Antibody by Complement Fix: <1:8 {titer}

## 2016-06-16 ENCOUNTER — Ambulatory Visit (INDEPENDENT_AMBULATORY_CARE_PROVIDER_SITE_OTHER): Payer: Commercial Managed Care - PPO | Admitting: Pulmonary Disease

## 2016-06-16 ENCOUNTER — Encounter: Payer: Self-pay | Admitting: Pulmonary Disease

## 2016-06-16 DIAGNOSIS — D860 Sarcoidosis of lung: Secondary | ICD-10-CM

## 2016-06-16 DIAGNOSIS — J3800 Paralysis of vocal cords and larynx, unspecified: Secondary | ICD-10-CM

## 2016-06-16 DIAGNOSIS — R05 Cough: Secondary | ICD-10-CM

## 2016-06-16 DIAGNOSIS — R059 Cough, unspecified: Secondary | ICD-10-CM

## 2016-06-16 LAB — PULMONARY FUNCTION TEST
DL/VA % PRED: 83 %
DL/VA: 4.26 ml/min/mmHg/L
DLCO cor % pred: 73 %
DLCO cor: 20.53 ml/min/mmHg
DLCO unc % pred: 74 %
DLCO unc: 20.9 ml/min/mmHg
FEF 25-75 Post: 2.73 L/sec
FEF 25-75 Pre: 2.15 L/sec
FEF2575-%Change-Post: 26 %
FEF2575-%PRED-PRE: 79 %
FEF2575-%Pred-Post: 100 %
FEV1-%Change-Post: 6 %
FEV1-%PRED-PRE: 96 %
FEV1-%Pred-Post: 102 %
FEV1-POST: 3.02 L
FEV1-PRE: 2.84 L
FEV1FVC-%CHANGE-POST: 6 %
FEV1FVC-%PRED-PRE: 94 %
FEV6-%CHANGE-POST: 0 %
FEV6-%PRED-POST: 104 %
FEV6-%Pred-Pre: 104 %
FEV6-PRE: 3.79 L
FEV6-Post: 3.82 L
FEV6FVC-%PRED-PRE: 103 %
FEV6FVC-%Pred-Post: 103 %
FVC-%CHANGE-POST: 0 %
FVC-%PRED-POST: 101 %
FVC-%PRED-PRE: 101 %
FVC-POST: 3.82 L
FVC-Pre: 3.81 L
POST FEV6/FVC RATIO: 100 %
PRE FEV6/FVC RATIO: 100 %
Post FEV1/FVC ratio: 79 %
Pre FEV1/FVC ratio: 75 %
RV % PRED: 129 %
RV: 2.64 L
TLC % PRED: 111 %
TLC: 6.09 L

## 2016-06-16 NOTE — Assessment & Plan Note (Signed)
I believe she has sarcoidosis of the lungs, but fortunately she has completely normal lung function today and she has no signs or symptoms of active pulmonary disease. The only question is whether or not she has an infection from the [place] identified on her BAL. It is important to rule out infection from a fungal organism for making a diagnosis of sarcoidosis. She has a significant exposure history to molds. She will continue to follow-up with infectious disease regarding this.  Plan summary Continue follow-up with infectious disease No role for steroids at this time Continue surveillance with every 6 month pulmonary function testing and 6 minute walks She refuses flu shot Follow-up 6 months

## 2016-06-16 NOTE — Assessment & Plan Note (Signed)
Due to GERD. Continue antacid therapy. GI notes reviewed today where Zantac was recommended.

## 2016-06-16 NOTE — Patient Instructions (Signed)
Keep taking the antacid prescribed by gastroenterology Stay active We will see you back in 6 months with a lung function test, 6 minute walk on the next visit

## 2016-06-16 NOTE — Assessment & Plan Note (Signed)
Continue follow-up with ENT.  Greater than 50% of today's visit was spent face-to-face, 27 minute visit

## 2016-06-16 NOTE — Progress Notes (Signed)
   Subjective:    Patient ID: Natasha Fowler, female    DOB: 1961-03-25, 55 y.o.   MRN: [id number]  Synopsis: Natasha Fowler had a transbronchial biopsy in June 2017 showing noncaseating granulomas. The BAL grew out a yeast. She also was noted to have vocal cord paralysis during that endoscopy.  HPI No chief complaint on file.  Natasha Fowler says that she had a lt of problems with abdominal pain in her epigastrum and between her shoulder blades.  She stopped taking the prilosec and the symptoms went away. She has now weaned herself prilosec and no longer has the pain.  She is now taking Zantac.  While taking prilosec her cough went away.  The cough is now back again but is not as severe.    Past Medical History:  Diagnosis Date  . Anxiety   . Arthritis   . Colon polyp   . Gastritis   . GERD (gastroesophageal reflux disease)   . Hyperlipidemia   . Thoracic aortic aneurysm ([place])   . Vocal cord paralysis, unilateral complete 04/06/2016   left, noted on bronchoscopy      Review of Systems  Constitutional: Negative for chills, fatigue and fever.  HENT: Negative for rhinorrhea, sinus pressure and sneezing.   Respiratory: Positive for cough. Negative for shortness of breath and wheezing.   Cardiovascular: Negative for chest pain, palpitations and leg swelling.       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 2015 [facility] (Dr. Gwenyth Allegra) evaluated the patient and  recommended a bronchoscopy. She refused. She was treated with Advair. Serologies including serum ace, Anka, ANA, QuantiFERON goal, and hypersensitivity pneumonitis panel were reportedly normal. 02/2016 CT chest> progression of bronchovascular nodularity and calcified mediastinal lymphadenopathy     Assessment & Plan:  Pulmonary sarcoidosis ([place]) I believe she has sarcoidosis of the lungs, but fortunately she has completely normal lung function today and she has no signs or symptoms of active pulmonary disease. The only question is whether or not she has an infection from the [place] identified on her BAL. It is important to rule out infection from a fungal organism for making a diagnosis of sarcoidosis. She has a significant exposure history to molds. She will continue to follow-up with infectious disease regarding this.  Plan summary Continue follow-up with infectious disease No role for steroids at this time Continue surveillance with every 6 month pulmonary function testing and 6 minute walks She refuses flu shot Follow-up 6 months  Cough Due to GERD. Continue antacid therapy. GI notes reviewed today where Zantac was recommended.  Paralyzed vocal cords Continue follow-up with ENT.  Greater than 50% of today's visit was spent face-to-face, 27 minute visit    Current Outpatient Prescriptions:  .  estradiol-norethindrone (COMBIPATCH) 0.05-0.14 MG/DAY, Place 1 patch onto the skin once a week., Disp: , Rfl:

## 2016-08-26 ENCOUNTER — Other Ambulatory Visit: Payer: Self-pay | Admitting: *Deleted

## 2016-08-26 DIAGNOSIS — I712 Thoracic aortic aneurysm, without rupture, unspecified: Secondary | ICD-10-CM

## 2016-10-06 ENCOUNTER — Other Ambulatory Visit: Payer: Commercial Managed Care - PPO

## 2016-10-06 ENCOUNTER — Ambulatory Visit: Payer: Commercial Managed Care - PPO | Admitting: Cardiothoracic Surgery

## 2016-10-27 ENCOUNTER — Telehealth: Payer: Self-pay | Admitting: *Deleted

## 2016-10-27 NOTE — Telephone Encounter (Signed)
Placed call to patient to r/s f/u appt w/ Dr Prescott Gum and CTA that was missed on 12/20//left vm for patient to call back.

## 2016-10-29 ENCOUNTER — Telehealth: Payer: Self-pay | Admitting: Internal Medicine

## 2016-10-29 ENCOUNTER — Encounter: Payer: Self-pay | Admitting: *Deleted

## 2016-10-29 NOTE — Telephone Encounter (Signed)
Pt requesting to be seen asap for wt loss. States she has not been trying to lose weight, reports she just got back from a trip and thought she should have gained wt. States she is down to 115lbs. Pt scheduled to see Dr. Hilarie Fredrickson 11/01/16@8 :30am. Pt aware of appt.

## 2016-11-01 ENCOUNTER — Encounter: Payer: Self-pay | Admitting: Internal Medicine

## 2016-11-01 ENCOUNTER — Ambulatory Visit (INDEPENDENT_AMBULATORY_CARE_PROVIDER_SITE_OTHER): Payer: Commercial Managed Care - PPO | Admitting: Internal Medicine

## 2016-11-01 VITALS — BP 118/80 | HR 84 | Ht 66.5 in | Wt 117.5 lb

## 2016-11-01 DIAGNOSIS — R634 Abnormal weight loss: Secondary | ICD-10-CM | POA: Diagnosis not present

## 2016-11-01 DIAGNOSIS — K219 Gastro-esophageal reflux disease without esophagitis: Secondary | ICD-10-CM

## 2016-11-01 NOTE — Progress Notes (Signed)
Subjective:    Patient ID: Natasha Fowler, female    DOB: September 21, 1961, 56 y.o.   MRN: [id number]  HPI Chalita Boughman is a 56 year old female with history of GERD, chronic cough, thoracic aortic aneurysm, vocal cord paralysis, pulmonary sarcoid versus fungal pneumonia who is here for follow-up. She was last seen on 06/08/2016. After her last visit we recommended ranitidine twice daily for chronic cough, reflux symptomatology. She took this but developed similar symptoms with epigastric pain radiating to her back and upper abdominal bloating. This also happened when she used omeprazole. When she stops this the symptoms seemed to resolve. She still has "thick mucus" and "gurgling in the back of my throat" which she relates to reflux. She's not having much heartburn. She denies abdominal bloating. Overall her cough is been better. She has been losing weight which concerns her she is down 7 pounds from her visit in August and weighs 117 pounds today. She has dramatically reduced her food intake particularly fatty foods and her evening meals in order to improve reflux. She is now eating between 3 and 5 PM and nothing after. She also endorses considerable stress with her mother's health, her son graduated from college and having recently moved to [place]. Bowel movements have been soft occurring 1-2 times per day. No blood or melena in her stools. Occasionally she has the epigastric and middle abdominal discomfort which seems to come and go and is never consistent. This is worse with stress.  Recently she has been seen for head pressure, her left ear feeling "stopped up" and changes in her vision. She's also noticed that her right pupil is not as reactive as her left and also she feels like left eyelid droop. She was seen yesterday and has been referred for a head CT and neurology consultation. The symptoms of been present since mid December.   Review of Systems As per history of present illness, otherwise  negative  Current Medications, Allergies, Past Medical History, Past Surgical History, Family History and Social History were reviewed in [employer] record.     Objective:   Physical Exam BP 118/80 (BP Location: Left Arm, Patient Position: Sitting, Cuff Size: Normal)   Pulse 84   Ht 5' 6.5" (1.689 m) Comment: height measured without shoes  Wt 117 lb 8 oz (53.3 kg)   BMI 18.68 kg/m  Constitutional: Well-developed and well-nourished. No distress. HEENT: Normocephalic and atraumatic. Oropharynx is clear and moist. No oropharyngeal exudate. Conjunctivae are normal.  No scleral icterus. Neck: Neck supple. Trachea midline. Cardiovascular: Normal rate, regular rhythm and intact distal pulses. No M/R/G Pulmonary/chest: Effort normal and breath sounds normal. No wheezing, rales or rhonchi. Abdominal: Soft, thin, nondistended. Bowel sounds active throughout. There are no masses palpable. No hepatosplenomegaly. Extremities: no clubbing, cyanosis, or edema Lymphadenopathy: No cervical adenopathy noted. Neurological: Alert and oriented to person place and time. Skin: Skin is warm and dry. No rashes noted. Psychiatric: Normal mood and affect. Behavior is normal.     Assessment & Plan:  56 year old female with history of GERD, chronic cough, thoracic aortic aneurysm, vocal cord paralysis, pulmonary sarcoid versus fungal pneumonia who is here for follow-up  1. GERD -- her throat clearing, mucus and posterior oropharynx symptoms may be related to reflux. She has been intolerant H2 blocker and PPI due to upper abdominal pressure and discomfort. We had previously discussed doing a 24-hour pH study to determine whether or not she truly has pathologic reflux. For her this would be off  of therapy. For now she wishes to proceed with the CT scan of her head as well as neurology evaluation before further addressing this issue. She will continue GERD diet.  2. Weight loss -- this may be  secondary to diet and caloric reduction which for her is been significant trying to combat reflux type symptoms. She is also self reporting great deal of stress which can affect appetite. We discussed cross-sectional imaging versus ultrasound the abdomen to address the symptom but again she wishes to defer this for now and readdress at follow-up. --She has had prior EGD on the whole which was unremarkable as well as colonoscopy is up-to-date  She will return in 3-4 months, sooner if necessary at which point we can readdress overall symptoms including reflux and weight loss.  25 minutes spent with the patient today. Greater than 50% was spent in counseling and coordination of care with the patient

## 2016-11-01 NOTE — Patient Instructions (Addendum)
Please follow up in 3 months with Dr Hilarie Fredrickson.  Please follow a GERD diet.   Food Choices for Gastroesophageal Reflux Disease, Adult When you have gastroesophageal reflux disease (GERD), the foods you eat and your eating habits are very important. Choosing the right foods can help ease your discomfort. What guidelines do I need to follow?  Choose fruits, vegetables, whole grains, and low-fat dairy products.  Choose low-fat meat, fish, and poultry.  Limit fats such as oils, salad dressings, butter, nuts, and avocado.  Keep a food diary. This helps you identify foods that cause symptoms.  Avoid foods that cause symptoms. These may be different for everyone.  Eat small meals often instead of 3 large meals a day.  Eat your meals slowly, in a place where you are relaxed.  Limit fried foods.  Cook foods using methods other than frying.  Avoid drinking alcohol.  Avoid drinking large amounts of liquids with your meals.  Avoid bending over or lying down until 2-3 hours after eating. What foods are not recommended? These are some foods and drinks that may make your symptoms worse: Vegetables  Tomatoes. Tomato juice. Tomato and spaghetti sauce. Chili peppers. Onion and garlic. Horseradish. Fruits  Oranges, grapefruit, and lemon (fruit and juice). Meats  High-fat meats, fish, and poultry. This includes hot dogs, ribs, ham, sausage, salami, and bacon. Dairy  Whole milk and chocolate milk. Sour cream. Cream. Butter. Ice cream. Cream cheese. Drinks  Coffee and tea. Bubbly (carbonated) drinks or energy drinks. Condiments  Hot sauce. Barbecue sauce. Sweets/Desserts  Chocolate and cocoa. Donuts. Peppermint and spearmint. Fats and Oils  High-fat foods. This includes [place] fries and potato chips. Other  Vinegar. Strong spices. This includes black pepper, white pepper, red pepper, cayenne, curry powder, cloves, ginger, and chili powder. The items listed above may not be a complete list  of foods and drinks to avoid. Contact your dietitian for more information.  This information is not intended to replace advice given to you by your health care provider. Make sure you discuss any questions you have with your health care provider. Document Released: 04/04/2012 Document Revised: 03/11/2016 Document Reviewed: 08/08/2013 Elsevier Interactive Patient Education  2017 [employer].

## 2017-06-15 ENCOUNTER — Ambulatory Visit: Payer: Commercial Managed Care - PPO | Admitting: Cardiothoracic Surgery

## 2017-06-15 ENCOUNTER — Other Ambulatory Visit: Payer: Commercial Managed Care - PPO

## 2017-08-01 ENCOUNTER — Ambulatory Visit: Payer: Commercial Managed Care - PPO | Admitting: Pulmonary Disease

## 2017-08-12 ENCOUNTER — Ambulatory Visit (INDEPENDENT_AMBULATORY_CARE_PROVIDER_SITE_OTHER): Payer: Commercial Managed Care - PPO | Admitting: Pulmonary Disease

## 2017-08-12 ENCOUNTER — Encounter: Payer: Self-pay | Admitting: Pulmonary Disease

## 2017-08-12 VITALS — BP 120/68 | HR 75 | Ht 67.5 in | Wt 125.8 lb

## 2017-08-12 DIAGNOSIS — R0602 Shortness of breath: Secondary | ICD-10-CM

## 2017-08-12 DIAGNOSIS — I712 Thoracic aortic aneurysm, without rupture, unspecified: Secondary | ICD-10-CM

## 2017-08-12 DIAGNOSIS — D860 Sarcoidosis of lung: Secondary | ICD-10-CM | POA: Diagnosis not present

## 2017-08-12 NOTE — Patient Instructions (Signed)
Thoracic aortic aneurysm: We will arrange for a CT angiogram of your chest  Increasing shortness of breath in the setting of sarcoidosis: Pulmonary function test We will follow-up the images from the CT chest  We will see you back in late November

## 2017-08-12 NOTE — Progress Notes (Signed)
Subjective:    Patient ID: Natasha Fowler, female    DOB: [birth year], 56 y.o.   MRN: [id number]  Synopsis: Elisea Khader had a transbronchial biopsy in June 2017 showing noncaseating granulomas. The BAL grew out a yeast. She also was noted to have vocal cord paralysis during that endoscopy. [facility] (Dr. Gwenyth Allegra) evaluated the patient and recommended a bronchoscopy. She refused. She was treated with Advair. Serologies including serum ace, Anka, ANA, QuantiFERON goal, and hypersensitivity pneumonitis panel were reportedly normal.  HPI Chief Complaint  Patient presents with  . Follow-up    pt states breathing is doing well. pt reports sob with exertion & clearing throat.    Natasha Fowler says that back in January she woke up with a droopy left eye and was diagnosed with Horner's syndrome. She had a head MRI, cervical spine MRI.  They later recommended a chest CT chest, but she hasn't schedule this yet.   She tells me that she has dyspnea: > she now lives in [place] and is currently living in a camper > she says that she hikes a lot and she feels winded when she hikes > she says taht sometimes she feels like she is going to pass out with a brisk   She says that she has noticed more redness and itching over her eyes left greater than right.     Past Medical History:  Diagnosis Date  . Anxiety   . Arthritis   . Colon polyp   . Gastritis   . GERD (gastroesophageal reflux disease)   . Hyperlipidemia   . Sarcoidosis ([place])   . Thoracic aortic aneurysm ([place])   . Vocal cord paralysis   . Vocal cord paralysis, unilateral complete 04/06/2016   left, noted on bronchoscopy      Review of Systems  Constitutional: Negative for chills, fatigue and fever.  HENT: Negative for rhinorrhea, sinus pressure and sneezing.   Respiratory: Positive for cough and shortness of breath. Negative for wheezing.   Cardiovascular: Negative for chest pain, palpitations and leg swelling.         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 02/2016 CT chest> progression of bronchovascular nodularity and calcified mediastinal lymphadenopathy       Assessment & Plan:   Pulmonary sarcoidosis ([place]) - Plan: Pulmonary function test  Shortness of breath - Plan: Pulmonary function test  Thoracic aortic aneurysm without rupture ([place])  Discussion: Lorrena notes an increase in shortness of breath with heavy exercise over the last year.  She says that she has been intentionally exercising more but despite this she is having more shortness of breath.  This could be related to her sarcoidosis so we need to get a lung function test to follow-up further.  While we are evaluating her we will also order her CT angiogram to evaluate her thoracic aortic aneurysm as she did not have that earlier  this year.  We will also be able to see her pulmonary parenchyma on that study.  Plan: Thoracic aortic aneurysm: We will arrange for a CT angiogram of your chest  Increasing shortness of breath in the setting of sarcoidosis: Pulmonary function test We will follow-up the images from the CT chest  We will see you back in late November    Current Outpatient Prescriptions:  .  estradiol-norethindrone (COMBIPATCH) 0.05-0.14 MG/DAY, Place 1 patch onto the skin once a week., Disp: , Rfl:   .  predniSONE (DELTASONE) 10 MG tablet, Take by mouth as directed., Disp: , Rfl:

## 2017-09-16 ENCOUNTER — Ambulatory Visit (INDEPENDENT_AMBULATORY_CARE_PROVIDER_SITE_OTHER): Payer: Commercial Managed Care - PPO | Admitting: Pulmonary Disease

## 2017-09-16 ENCOUNTER — Ambulatory Visit (INDEPENDENT_AMBULATORY_CARE_PROVIDER_SITE_OTHER)
Admission: RE | Admit: 2017-09-16 | Discharge: 2017-09-16 | Disposition: A | Discharge status: Home / Self Care | Payer: Commercial Managed Care - PPO | Source: Ambulatory Visit | Attending: Pulmonary Disease | Admitting: Pulmonary Disease

## 2017-09-16 DIAGNOSIS — D860 Sarcoidosis of lung: Secondary | ICD-10-CM | POA: Diagnosis not present

## 2017-09-16 DIAGNOSIS — I712 Thoracic aortic aneurysm, without rupture, unspecified: Secondary | ICD-10-CM

## 2017-09-16 DIAGNOSIS — R0602 Shortness of breath: Secondary | ICD-10-CM

## 2017-09-16 LAB — PULMONARY FUNCTION TEST
DL/VA % PRED: 95 %
DL/VA: 4.88 ml/min/mmHg/L
DLCO UNC % PRED: 75 %
DLCO cor % pred: 89 %
DLCO cor: 25.11 ml/min/mmHg
DLCO unc: 21.02 ml/min/mmHg
FEF 25-75 PRE: 2.2 L/s
FEF 25-75 Post: 2.69 L/sec
FEF2575-%CHANGE-POST: 21 %
FEF2575-%Pred-Post: 101 %
FEF2575-%Pred-Pre: 83 %
FEV1-%Change-Post: 4 %
FEV1-%PRED-PRE: 94 %
FEV1-%Pred-Post: 99 %
FEV1-PRE: 2.76 L
FEV1-Post: 2.9 L
FEV1FVC-%Change-Post: 3 %
FEV1FVC-%Pred-Pre: 96 %
FEV6-%CHANGE-POST: 1 %
FEV6-%PRED-POST: 101 %
FEV6-%PRED-PRE: 99 %
FEV6-POST: 3.67 L
FEV6-PRE: 3.6 L
FEV6FVC-%Change-Post: 0 %
FEV6FVC-%PRED-POST: 103 %
FEV6FVC-%PRED-PRE: 102 %
FVC-%Change-Post: 1 %
FVC-%PRED-POST: 98 %
FVC-%Pred-Pre: 96 %
FVC-Post: 3.67 L
FVC-Pre: 3.61 L
POST FEV6/FVC RATIO: 100 %
Post FEV1/FVC ratio: 79 %
Pre FEV1/FVC ratio: 76 %
Pre FEV6/FVC Ratio: 100 %
RV % PRED: 98 %
RV: 2.03 L
TLC % pred: 101 %
TLC: 5.53 L

## 2017-09-16 MED ORDER — IOPAMIDOL (ISOVUE-370) INJECTION 76%
100.0000 mL | Freq: Once | INTRAVENOUS | Status: AC | PRN
  Administered 2017-09-16: 100 mL via INTRAVENOUS

## 2017-09-16 NOTE — Progress Notes (Signed)
PFT done today. 

## 2017-09-16 NOTE — Progress Notes (Signed)
Spoke with patient and made her aware of results. She verbalized understanding and did not have any questions. Nothing further is needed.

## 2017-09-19 ENCOUNTER — Ambulatory Visit: Payer: Commercial Managed Care - PPO | Admitting: Pulmonary Disease

## 2017-09-23 ENCOUNTER — Encounter: Payer: Self-pay | Admitting: Pulmonary Disease

## 2017-09-23 ENCOUNTER — Ambulatory Visit: Payer: Commercial Managed Care - PPO | Admitting: Pulmonary Disease

## 2017-09-23 VITALS — BP 118/70 | HR 75 | Ht 67.0 in | Wt 125.0 lb

## 2017-09-23 DIAGNOSIS — D860 Sarcoidosis of lung: Secondary | ICD-10-CM

## 2017-09-23 NOTE — Progress Notes (Signed)
Subjective:    Patient ID: Natasha Fowler, female    DOB: 04-06-1961, 56 y.o.   MRN: [id number]  Synopsis: Natasha Fowler had a transbronchial biopsy in June 2017 showing noncaseating granulomas. The BAL grew out a yeast. She also was noted to have vocal cord paralysis during that endoscopy. [facility] (Dr. Gwenyth Allegra) evaluated the patient and recommended a bronchoscopy. She refused. She was treated with Advair. Serologies including serum ace, Anka, ANA, QuantiFERON goal, and hypersensitivity pneumonitis panel were reportedly normal.  HPI Chief Complaint  Patient presents with  . Follow-up    results from CT scan   Natasha Fowler says that her breathing is been stable since last visit.  She has had a little bit more acid reflux which tends to make her breathing worse but for the most part she has not had much problems.  No recent cough or wheeze.  She has been exercising more and she says that when she climbs hills while hiking she notices that she will sometimes hold her breath and then she has to gasp her breathing afterwards.  In general though her breathing has been okay.  Past Medical History:  Diagnosis Date  . Anxiety   . Arthritis   . Colon polyp   . Gastritis   . GERD (gastroesophageal reflux disease)   . Hyperlipidemia   . Sarcoidosis   . Thoracic aortic aneurysm ([place])   . Vocal cord paralysis   . Vocal cord paralysis, unilateral complete 04/06/2016   left, noted on bronchoscopy      Review of Systems  Constitutional: Negative for chills, fatigue and fever.  HENT: Negative for rhinorrhea, sinus pressure and sneezing.   Respiratory: Positive for cough and shortness of breath. Negative for wheezing.   Cardiovascular: Negative for chest pain, palpitations and leg swelling.       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 02/2016 CT chest> progression of bronchovascular nodularity and calcified mediastinal lymphadenopathy November 2018 CT angiogram showed a 4 cm thoracic aortic aneurysm, stable mediastinal adenopathy and spiculated densities throughout the lungs.  Pulmonary function testing: November 2018 FVC 3.67 L, 98% predicted, DLCO 21.02 mL 75% predicted      Assessment & Plan:   Pulmonary sarcoidosis ([place]) - Plan: Pulmonary Function Test  Discussion: Lung function testing today showed a slight decrease in forced vital capacity but a stable DLCO compared to the last visit.  I have personally reviewed the CT scan of her chest from November and compared to the one from a year ago and I really see no significant change in the bilateral patchy airspace disease consistent with her known diagnosis of sarcoidosis.  So at this time I see no clear evidence that the sarcoid has progressed.  However, because of the decline in the SVC and her very slight perception of dyspnea in the last year I would like to repeat a pulmonary function test in  6 months to watch her a bit closer.  Plan: Pulmonary sarcoid: We will repeat pulmonary function testing in 6 months  Shortness of breath: Stay active, exercise regularly, try to take her mind off of the intensity of your exercise, try listening to music while exercising  We will see back in 6 months or sooner if needed    Current Outpatient Medications:  .  estradiol-norethindrone (COMBIPATCH)  0.05-0.14 MG/DAY, Place 1 patch onto the skin once a week., Disp: , Rfl:

## 2017-09-23 NOTE — Patient Instructions (Signed)
Pulmonary sarcoid: We will repeat pulmonary function testing in 6 months  Shortness of breath: Stay active, exercise regularly, try to take her mind off of the intensity of your exercise, try listening to music while exercising  We will see back in 6 months or sooner if needed

## 2018-01-03 ENCOUNTER — Telehealth: Payer: Self-pay | Admitting: Internal Medicine

## 2018-01-03 NOTE — Telephone Encounter (Signed)
Patient has been advised that she needs office visit as Dr Garth Schlatter last note indicates that she should follow up in 3 months from her last visit 10/2016. She is also only taking ranitidine PRN and has not filled rx since 2017. She is advised to get rx over the counter for now until her visit that she has scheduled with [place] in June 2019 at which time we can fill as a prescription if needed. She verbalizes understanding.

## 2018-03-21 ENCOUNTER — Ambulatory Visit: Payer: Commercial Managed Care - PPO | Admitting: Internal Medicine

## 2018-06-22 ENCOUNTER — Encounter

## 2018-06-22 ENCOUNTER — Ambulatory Visit: Payer: Commercial Managed Care - PPO | Admitting: Gastroenterology

## 2018-06-22 ENCOUNTER — Encounter: Payer: Self-pay | Admitting: Gastroenterology

## 2018-06-22 VITALS — BP 106/64 | HR 69 | Ht 67.0 in | Wt 119.0 lb

## 2018-06-22 DIAGNOSIS — R1031 Right lower quadrant pain: Secondary | ICD-10-CM

## 2018-06-22 DIAGNOSIS — R1011 Right upper quadrant pain: Secondary | ICD-10-CM

## 2018-06-22 DIAGNOSIS — R112 Nausea with vomiting, unspecified: Secondary | ICD-10-CM | POA: Insufficient documentation

## 2018-06-22 DIAGNOSIS — R197 Diarrhea, unspecified: Secondary | ICD-10-CM | POA: Diagnosis not present

## 2018-06-22 MED ORDER — NA SULFATE-K SULFATE-MG SULF 17.5-3.13-1.6 GM/177ML PO SOLN: 1.0000 | ORAL | 0 refills | Status: DC

## 2018-06-22 NOTE — Patient Instructions (Signed)

## 2018-06-22 NOTE — Progress Notes (Addendum)
06/22/2018 Natasha Fowler [id number] 09-21-1961   HISTORY OF PRESENT ILLNESS:  This is a 57 year old female who is a patient of Dr. Vena Rua.  She has a history of GERD, chronic cough, thoracic aortic aneurysm, vocal cord paralysis, anxiety, and sarcoidosis.  She is here today with complaints of nausea, diarrhea, and right sided abdominal pain.  She is very dramatic when describing her symptoms.  She reports severe intermittent nausea since May.  Says that when it is present it has her "bed-ridden".  She is on pantoprazole 40 mg daily and has been taking it every day for the past 3 weeks even though she does not like taking medication.  So far is not really sure that she has had any improvement.  Complains of a "gurggling or vibration sensation" in her right lower quadrant, no necessarily pain per se.  Describes some RUQ pains as well.  Gets diarrhea often after eating.  Weight is stable, actually up a couple of pounds since she was seen here in 10/2016.  She has multiple complaints today, reporting several non-GI issues as well.  Last EGD in 12/2013 at [facility] with GERD, esophageal ring, and gastric polyp.  Last colonoscopy was 09/2013 at [facility] showed a cecal polyp, but biopsies showed unremarkable mucosa.  Biopsies showed mild chronic gastritis and fundic gland polyp.  She was told to have another colonoscopy at 5 year interval due to family history (colon cancer in [facility] and polyps in her mother).    Past Medical History:  Diagnosis Date  . Anxiety   . Arthritis   . Colon polyp   . Gastritis   . GERD (gastroesophageal reflux disease)   . Hyperlipidemia   . Sarcoidosis   . Thoracic aortic aneurysm ([place])   . Vocal cord paralysis   . Vocal cord paralysis, unilateral complete 04/06/2016   left, noted on bronchoscopy   Past Surgical History:  Procedure Laterality Date  . TONSILLECTOMY     age 53 , have grown back per Dr  . Tamsen Roers BRONCHOSCOPY Bilateral 04/06/2016   Procedure: VIDEO  BRONCHOSCOPY WITH FLUORO;  Surgeon: Juanito Doom, MD;  Location: [facility];  Service: Cardiopulmonary;  Laterality: Bilateral;    reports that she has never smoked. She has never used smokeless tobacco. She reports that she does not drink alcohol or use drugs. family history includes Colon cancer in her maternal grandmother; Colon polyps in her mother; Heart disease in her father. Allergies  Allergen Reactions  . Penicillins Anaphylaxis    Throat swelling  . Asa [Aspirin] Other (See Comments)    GI upset  . Codeine Hives  . Prilosec [Omeprazole]     Epigastric pressure  . Sulfa Antibiotics Hives      Outpatient Encounter Medications as of 06/22/2018  Medication Sig  . estradiol-norethindrone (COMBIPATCH) 0.05-0.14 MG/DAY Place 1 patch onto the skin once a week.   No facility-administered encounter medications on file as of 06/22/2018.      REVIEW OF SYSTEMS  : All other systems reviewed and negative except where noted in the History of Present Illness.   PHYSICAL EXAM: BP 106/64   Pulse 69   Ht 5\' 7"  (1.702 m)   Wt 119 lb (54 kg)   BMI 18.64 kg/m  General: Well developed white female in no acute distress; somewhat pressured speech and anxious Head: Normocephalic and atraumatic Eyes:  Sclerae anicteric, conjunctiva pink. Ears: Normal auditory acuity Lungs: Clear throughout to auscultation; no increased WOB. Heart: Regular  rate and rhythm; no M/R/G. Abdomen: Soft, non-distended.  BS present.  Mild right sided TTP. Rectal:  Will be done at the time of colonoscopy. Musculoskeletal: Symmetrical with no gross deformities  Skin: No lesions on visible extremities Extremities: No edema  Neurological: Alert oriented x 4, grossly non-focal Psychological:  Alert and cooperative. Normal mood and affect  ASSESSMENT AND PLAN: *57 year old female with complaints of severe intermittent nausea, intermittent diarrhea (mostly post-prandial), and right sided abdominal pain, both RUQ  and RLQ.  Last colonoscopy 2014 and last EGD 2015 both relatively unremarkable.  She likely has IBS that accompanies her anxiety but is convinced that there are more severe issues.  Will schedule for EGD and colonscopy with Dr. Hilarie Fredrickson (due for surveillance colonoscopy in December anyway).  She does not like to take medication so wants to wait until procedures are performed (I offered levsin).  She was offered earlier procedures with other physicians but declined and preferred to choose and October date with Dr. Hilarie Fredrickson.  **The risks, benefits, and alternatives to EGD and colonoscopy were discussed with the patient and she consents to proceed.   CC:  Kristopher Glee., MD  Addendum: Reviewed and agree with initial management. Pyrtle, Lajuan Lines, MD

## 2018-06-30 DIAGNOSIS — R197 Diarrhea, unspecified: Secondary | ICD-10-CM | POA: Insufficient documentation

## 2018-08-10 ENCOUNTER — Ambulatory Visit: Payer: Commercial Managed Care - PPO | Admitting: Internal Medicine

## 2018-08-10 ENCOUNTER — Encounter: Payer: Self-pay | Admitting: Internal Medicine

## 2018-08-10 VITALS — BP 125/80 | HR 76 | Temp 99.3°F | Resp 12 | Ht 67.0 in | Wt 119.0 lb

## 2018-08-10 DIAGNOSIS — R112 Nausea with vomiting, unspecified: Secondary | ICD-10-CM

## 2018-08-10 DIAGNOSIS — G902 Horner's syndrome: Secondary | ICD-10-CM | POA: Insufficient documentation

## 2018-08-10 DIAGNOSIS — R1011 Right upper quadrant pain: Secondary | ICD-10-CM

## 2018-08-10 DIAGNOSIS — R1031 Right lower quadrant pain: Secondary | ICD-10-CM

## 2018-08-10 MED ORDER — SODIUM CHLORIDE 0.9 % IV SOLN: 500.0000 mL | Freq: Once | INTRAVENOUS | Status: DC

## 2018-08-10 MED ORDER — ONDANSETRON HCL 4 MG PO TABS: 4.0000 mg | ORAL_TABLET | Freq: Three times a day (TID) | ORAL | 0 refills | Status: DC | PRN

## 2018-08-10 NOTE — Op Note (Signed)
[facility] Patient Name: Natasha Fowler Procedure Date: 08/10/2018 2:43 PM MRN: [id number] Endoscopist: Jerene Bears , MD Age: 58 Referring MD:  Date of Birth: Dec 06, 1960 Gender: Female Account #: [id number] Procedure:                Colonoscopy Indications:              Colon cancer screening in patient at increased                            risk: Family history of 1st-degree relative with                            colon polyps, Last colonoscopy 5 years ago Medicines:                None; medications were administered (propofol)                            prior to planned upper endoscopy. Before the upper                            endoscope could be inserted per mouth the patient                            had a laryngospasm which took several minutes to                            resolve. She was treated with lidocaine and Robinul                            IV. The upper endoscopy was never performed. Procedure:                Pre-Anesthesia Assessment:                           - Prior to the procedure, a History and Physical                            was performed, and patient medications and                            allergies were reviewed. The patient's tolerance of                            previous anesthesia was also reviewed. The risks                            and benefits of the procedure and the sedation                            options and risks were discussed with the patient.                            All questions were answered, and informed consent  was obtained. Prior Anticoagulants: The patient has                            taken no previous anticoagulant or antiplatelet                            agents. ASA Grade Assessment: II - A patient with                            mild systemic disease. After reviewing the risks                            and benefits, the patient was deemed in   satisfactory condition to undergo the procedure.                           After obtaining informed consent, the colonoscope                            was passed under direct vision. Throughout the                            procedure, the patient's blood pressure, pulse, and                            oxygen saturations were monitored continuously. The                            Colonoscope was introduced through the anus with                            the intention of advancing to the cecum. The scope                            was advanced to the descending colon before the                            procedure was aborted. Medications were not given.                            The colonoscopy was performed without difficulty.                            The patient tolerated the procedure fairly well.                            The quality of the bowel preparation was good. Findings:                 The digital rectal exam was normal.                           A few small-mouthed diverticula were found in the  sigmoid colon. The unsedated exam was terminated                            due to patient discomfort.                           Retroflexion in the rectum was not performed due to                            aborting the procedure before completion. Complications:            No immediate complications. Estimated Blood Loss:     Estimated blood loss: none. Impression:               - Diverticulosis in the sigmoid colon.                           - Incomplete colonoscopy performed unsedated after                            sedation for upper endoscopy led to laryngospasm.                           - No specimens collected. Recommendation:           - Patient has a contact number available for                            emergencies. The signs and symptoms of potential                            delayed complications were discussed with the                             patient. Return to normal activities tomorrow.                            Written discharge instructions were provided to the                            patient.                           - Resume previous diet.                           - Continue present medications.                           - Repeat EGD/colonoscopy at appointment to be                            scheduled in the outpatient hospital setting with                            anesthesia given vocal cord dysfunction/spasm.                           -  ENT referral for evaluation of vocal cord                            paralysis and frequent vocal cord spasm/laryngal                            spasm Jerene Bears, MD 08/10/2018 3:43:24 PM This report has been signed electronically.

## 2018-08-10 NOTE — Patient Instructions (Signed)
YOU HAD AN ENDOSCOPIC PROCEDURE TODAY AT THE [facility]:   Refer to the procedure report that was given to you for any specific questions about what was found during the examination.  If the procedure report does not answer your questions, please call your gastroenterologist to clarify.  If you requested that your care partner not be given the details of your procedure findings, then the procedure report has been included in a sealed envelope for you to review at your convenience later.  YOU SHOULD EXPECT: Some feelings of bloating in the abdomen. Passage of more gas than usual.  Walking can help get rid of the air that was put into your GI tract during the procedure and reduce the bloating. If you had a lower endoscopy (such as a colonoscopy or flexible sigmoidoscopy) you may notice spotting of blood in your stool or on the toilet paper. If you underwent a bowel prep for your procedure, you may not have a normal bowel movement for a few days.  Please Note:  You might notice some irritation and congestion in your nose or some drainage.  This is from the oxygen used during your procedure.  There is no need for concern and it should clear up in a day or so.  SYMPTOMS TO REPORT IMMEDIATELY:   Following lower endoscopy (colonoscopy or flexible sigmoidoscopy):  Excessive amounts of blood in the stool  Significant tenderness or worsening of abdominal pains  Swelling of the abdomen that is new, acute  Fever of 100F or higher   Following upper endoscopy (EGD)  Vomiting of blood or coffee ground material  New chest pain or pain under the shoulder blades  Painful or persistently difficult swallowing  New shortness of breath  Fever of 100F or higher  Black, tarry-looking stools  For urgent or emergent issues, a gastroenterologist can be reached at any hour by calling [phone].   DIET:  We do recommend a small meal at first, but then you may proceed to your regular diet.  Drink  plenty of fluids but you should avoid alcoholic beverages for 24 hours.  ACTIVITY:  You should plan to take it easy for the rest of today and you should NOT DRIVE or use heavy machinery until tomorrow (because of the sedation medicines used during the test).    FOLLOW UP: Our staff will call the number listed on your records the next business day following your procedure to check on you and address any questions or concerns that you may have regarding the information given to you following your procedure. If we do not reach you, we will leave a message.  However, if you are feeling well and you are not experiencing any problems, there is no need to return our call.  We will assume that you have returned to your regular daily activities without incident.  If any biopsies were taken you will be contacted by phone or by letter within the next 1-3 weeks.  Please call us at [phone] if you have not heard about the biopsies in 3 weeks.    SIGNATURES/CONFIDENTIALITY: You and/or your care partner have signed paperwork which will be entered into your electronic medical record.  These signatures attest to the fact that that the information above on your After Visit Summary has been reviewed and is understood.  Full responsibility of the confidentiality of this discharge information lies with you and/or your care-partner. 

## 2018-08-10 NOTE — Progress Notes (Signed)
After induction and right before scope was inserted pt laryngspasmed.  Jaw thrust was immediatelly applied but did not break.  100mg  lidocaine given.  Several minutes of pt breathing normal then back to spasming.  No more sedation was given and pt allowed to wake up (approx 15 min).  Dr pyrtle explained  to pt in room what went on and told her we would not be doing the upper part and the only way we would do the colon was unsedated (since t spasmed with sedation only).  Pt agreed to try but asked [place] to stop in the sigmoid which we immediately did

## 2018-08-11 ENCOUNTER — Telehealth: Payer: Self-pay

## 2018-08-11 NOTE — Telephone Encounter (Signed)
  Follow up Call-  Call back number 08/10/2018  Post procedure Call Back phone  # [phone]  Permission to leave phone message Yes  Some recent data might be hidden     Patient questions:  Do you have a fever, pain , or abdominal swelling? No. Pain Score  0 *  Have you tolerated food without any problems? Yes.    Have you been able to return to your normal activities? Yes.    Do you have any questions about your discharge instructions: Diet   No. Medications  No. Follow up visit  No.  Do you have questions or concerns about your Care? No.  Actions: * If pain score is 4 or above: No action needed, pain <4.

## 2018-08-14 ENCOUNTER — Telehealth: Payer: Self-pay

## 2018-08-14 NOTE — Telephone Encounter (Signed)
Pt referral submitted via fax to [facility] [phone] (F)

## 2018-09-01 ENCOUNTER — Telehealth: Payer: Self-pay | Admitting: Internal Medicine

## 2018-09-01 NOTE — Telephone Encounter (Signed)
Pt called stating that she was returning Dr. Vena Rua nurse call. Pls try her again.

## 2018-09-01 NOTE — Telephone Encounter (Signed)
Does patient meet the criteria for care in [facility] given last attempt had laryngospasm?

## 2018-09-04 NOTE — Telephone Encounter (Signed)
Pt is calling back about scheduling a ECL before the end of year.  She said she saw her ENT and he told her it was okay to schedule the procedure

## 2018-09-04 NOTE — Telephone Encounter (Signed)
Once vocal cord is repaired by ENT, I think she would meet [facility] criteria.  She does not have a difficult airway, however she did have vocal cord dysfunction.  Thus, if this is repaired, she can likely have procedures in [place] Will check with J. Nulty to ensure anesthesia agrees [employer]

## 2018-09-04 NOTE — Telephone Encounter (Signed)
Okay, noted. Will determine if anesthesia okay proceeding in the [facility] If she has been seen by ENT at [facility] and they feel okay for procedures, I am okay proceeding

## 2018-09-04 NOTE — Telephone Encounter (Signed)
Dr. Hilarie Fredrickson,  With this pt's h/o laryngospasm with sedation, it would be prudent to perform her procedure in the hospital setting.  Thanks,  Osvaldo Angst

## 2018-09-04 NOTE — Telephone Encounter (Signed)
Pt called and states that she is not having surgery for her vocal cords she is having "voice therapy." [facility] MD states she should be ok for procedures but she could have another laryngospasm. Please advise if pt ok to do in the [place]. Note sent to Dr. Hilarie Fredrickson and Osvaldo Angst.

## 2018-09-07 ENCOUNTER — Telehealth: Payer: Self-pay | Admitting: Internal Medicine

## 2018-09-07 ENCOUNTER — Other Ambulatory Visit: Payer: Self-pay

## 2018-09-07 DIAGNOSIS — R112 Nausea with vomiting, unspecified: Secondary | ICD-10-CM

## 2018-09-07 DIAGNOSIS — Z1211 Encounter for screening for malignant neoplasm of colon: Secondary | ICD-10-CM

## 2018-09-07 NOTE — Telephone Encounter (Signed)
Pt scheduled for previsit 10/06/18@2pm , ECL scheduled at [facility] 10/23/18@9 :15am, pt to arrive there at 7:45am. Case# [id number]. Pt aware of appts and wants to be called if there are any cancellations.

## 2018-09-07 NOTE — Telephone Encounter (Signed)
Pt left msg with answering service. She called looking to schedule endo colon, per Dr. Vena Rua notes, procedure needs to be at the hosp. Pls call pt.

## 2018-09-26 ENCOUNTER — Other Ambulatory Visit: Payer: Self-pay | Admitting: Internal Medicine

## 2018-09-26 DIAGNOSIS — Z1231 Encounter for screening mammogram for malignant neoplasm of breast: Secondary | ICD-10-CM

## 2018-10-06 ENCOUNTER — Ambulatory Visit (AMBULATORY_SURGERY_CENTER): Payer: Self-pay

## 2018-10-06 VITALS — Ht 67.0 in | Wt 123.4 lb

## 2018-10-06 DIAGNOSIS — R197 Diarrhea, unspecified: Secondary | ICD-10-CM

## 2018-10-06 MED ORDER — NA SULFATE-K SULFATE-MG SULF 17.5-3.13-1.6 GM/177ML PO SOLN: 1.0000 | Freq: Once | ORAL | 0 refills | Status: AC

## 2018-10-06 NOTE — Progress Notes (Signed)
Denies allergies to eggs or soy products. Denies complication of anesthesia or sedation. Denies use of weight loss medication. Denies use of O2.   Emmi instructions declined.    A sample of Suprep was given to the patient.  

## 2018-10-13 ENCOUNTER — Encounter (HOSPITAL_COMMUNITY): Payer: Self-pay

## 2018-10-13 ENCOUNTER — Other Ambulatory Visit: Payer: Self-pay

## 2018-10-23 ENCOUNTER — Encounter (HOSPITAL_COMMUNITY)
Admission: RE | Disposition: A | Discharge status: Home / Self Care | Payer: Self-pay | Source: Home / Self Care | Attending: Internal Medicine

## 2018-10-23 ENCOUNTER — Ambulatory Visit (HOSPITAL_COMMUNITY): Payer: Commercial Managed Care - PPO | Admitting: Certified Registered Nurse Anesthetist

## 2018-10-23 ENCOUNTER — Other Ambulatory Visit: Payer: Self-pay

## 2018-10-23 ENCOUNTER — Encounter (HOSPITAL_COMMUNITY): Payer: Self-pay | Admitting: *Deleted

## 2018-10-23 ENCOUNTER — Ambulatory Visit (HOSPITAL_COMMUNITY)
Admission: RE | Admit: 2018-10-23 | Discharge: 2018-10-23 | Disposition: A | Discharge status: Home / Self Care | Payer: Commercial Managed Care - PPO | Attending: Internal Medicine | Admitting: Internal Medicine

## 2018-10-23 DIAGNOSIS — R197 Diarrhea, unspecified: Secondary | ICD-10-CM | POA: Insufficient documentation

## 2018-10-23 DIAGNOSIS — Z882 Allergy status to sulfonamides status: Secondary | ICD-10-CM | POA: Diagnosis not present

## 2018-10-23 DIAGNOSIS — Z91012 Allergy to eggs: Secondary | ICD-10-CM | POA: Insufficient documentation

## 2018-10-23 DIAGNOSIS — Z87892 Personal history of anaphylaxis: Secondary | ICD-10-CM | POA: Insufficient documentation

## 2018-10-23 DIAGNOSIS — F419 Anxiety disorder, unspecified: Secondary | ICD-10-CM | POA: Insufficient documentation

## 2018-10-23 DIAGNOSIS — K219 Gastro-esophageal reflux disease without esophagitis: Secondary | ICD-10-CM | POA: Diagnosis not present

## 2018-10-23 DIAGNOSIS — Z1211 Encounter for screening for malignant neoplasm of colon: Secondary | ICD-10-CM

## 2018-10-23 DIAGNOSIS — K5730 Diverticulosis of large intestine without perforation or abscess without bleeding: Secondary | ICD-10-CM | POA: Diagnosis not present

## 2018-10-23 DIAGNOSIS — K2210 Ulcer of esophagus without bleeding: Secondary | ICD-10-CM | POA: Insufficient documentation

## 2018-10-23 DIAGNOSIS — Z8371 Family history of colonic polyps: Secondary | ICD-10-CM | POA: Insufficient documentation

## 2018-10-23 DIAGNOSIS — D869 Sarcoidosis, unspecified: Secondary | ICD-10-CM | POA: Insufficient documentation

## 2018-10-23 DIAGNOSIS — K222 Esophageal obstruction: Secondary | ICD-10-CM | POA: Diagnosis not present

## 2018-10-23 DIAGNOSIS — D120 Benign neoplasm of cecum: Secondary | ICD-10-CM | POA: Diagnosis not present

## 2018-10-23 DIAGNOSIS — K449 Diaphragmatic hernia without obstruction or gangrene: Secondary | ICD-10-CM | POA: Diagnosis not present

## 2018-10-23 DIAGNOSIS — K529 Noninfective gastroenteritis and colitis, unspecified: Secondary | ICD-10-CM

## 2018-10-23 DIAGNOSIS — Z79899 Other long term (current) drug therapy: Secondary | ICD-10-CM | POA: Insufficient documentation

## 2018-10-23 DIAGNOSIS — K2950 Unspecified chronic gastritis without bleeding: Secondary | ICD-10-CM | POA: Insufficient documentation

## 2018-10-23 DIAGNOSIS — Z83719 Family history of colon polyps, unspecified: Secondary | ICD-10-CM

## 2018-10-23 DIAGNOSIS — I712 Thoracic aortic aneurysm, without rupture: Secondary | ICD-10-CM | POA: Diagnosis not present

## 2018-10-23 DIAGNOSIS — J385 Laryngeal spasm: Secondary | ICD-10-CM | POA: Diagnosis not present

## 2018-10-23 DIAGNOSIS — R1011 Right upper quadrant pain: Secondary | ICD-10-CM | POA: Diagnosis present

## 2018-10-23 DIAGNOSIS — D125 Benign neoplasm of sigmoid colon: Secondary | ICD-10-CM | POA: Insufficient documentation

## 2018-10-23 DIAGNOSIS — Z886 Allergy status to analgesic agent status: Secondary | ICD-10-CM | POA: Insufficient documentation

## 2018-10-23 DIAGNOSIS — R112 Nausea with vomiting, unspecified: Secondary | ICD-10-CM | POA: Diagnosis not present

## 2018-10-23 HISTORY — DX: Dyspnea, unspecified: R06.00

## 2018-10-23 HISTORY — DX: Personal history of other diseases of the digestive system: Z87.19

## 2018-10-23 HISTORY — PX: ESOPHAGOGASTRODUODENOSCOPY (EGD) WITH PROPOFOL: SHX5813

## 2018-10-23 HISTORY — PX: BIOPSY: SHX5522

## 2018-10-23 HISTORY — DX: Other complications of anesthesia, initial encounter: T88.59XA

## 2018-10-23 HISTORY — PX: POLYPECTOMY: SHX5525

## 2018-10-23 HISTORY — DX: Adverse effect of unspecified anesthetic, initial encounter: T41.45XA

## 2018-10-23 HISTORY — PX: COLONOSCOPY WITH PROPOFOL: SHX5780

## 2018-10-23 SURGERY — COLONOSCOPY WITH PROPOFOL
Anesthesia: Monitor Anesthesia Care

## 2018-10-23 MED ORDER — SODIUM CHLORIDE 0.9 % IV SOLN: INTRAVENOUS | Status: DC

## 2018-10-23 MED ORDER — PROPOFOL 10 MG/ML IV BOLUS
INTRAVENOUS | Status: AC
  Filled 2018-10-23: qty 40

## 2018-10-23 MED ORDER — LACTATED RINGERS IV SOLN
INTRAVENOUS | Status: DC
  Administered 2018-10-23: 08:00:00 via INTRAVENOUS

## 2018-10-23 MED ORDER — LIDOCAINE 2% (20 MG/ML) 5 ML SYRINGE
INTRAMUSCULAR | Status: DC | PRN
  Administered 2018-10-23: 100 mg via INTRAVENOUS

## 2018-10-23 MED ORDER — PHENYLEPHRINE 40 MCG/ML (10ML) SYRINGE FOR IV PUSH (FOR BLOOD PRESSURE SUPPORT)
PREFILLED_SYRINGE | INTRAVENOUS | Status: DC | PRN
  Administered 2018-10-23 (×2): 80 ug via INTRAVENOUS

## 2018-10-23 MED ORDER — MIDAZOLAM HCL 2 MG/2ML IJ SOLN
INTRAMUSCULAR | Status: AC
  Filled 2018-10-23: qty 2

## 2018-10-23 MED ORDER — PROPOFOL 10 MG/ML IV BOLUS
INTRAVENOUS | Status: DC | PRN
  Administered 2018-10-23: 20 mg via INTRAVENOUS

## 2018-10-23 MED ORDER — GLYCOPYRROLATE 0.2 MG/ML IJ SOLN
INTRAMUSCULAR | Status: DC | PRN
  Administered 2018-10-23: 0.2 mg via INTRAVENOUS

## 2018-10-23 MED ORDER — MIDAZOLAM HCL 5 MG/5ML IJ SOLN
INTRAMUSCULAR | Status: DC | PRN
  Administered 2018-10-23: 2 mg via INTRAVENOUS

## 2018-10-23 MED ORDER — PROPOFOL 500 MG/50ML IV EMUL
INTRAVENOUS | Status: DC | PRN
  Administered 2018-10-23: 150 ug/kg/min via INTRAVENOUS

## 2018-10-23 SURGICAL SUPPLY — 25 items

## 2018-10-23 NOTE — Op Note (Signed)
[facility] Patient Name: Natasha Fowler Procedure Date: 10/23/2018 MRN: [id number] Attending MD: Jerene Bears , MD Date of Birth: 1960-12-06 CSN: [id number] Age: 58 Admit Type: Outpatient Procedure:                Upper GI endoscopy Indications:              Abdominal pain in the right upper quadrant, Nausea Providers:                Lajuan Lines. Hilarie Fredrickson, MD, Burtis Junes, RN, Dorise Hiss, RN,                            Charolette Child, Technician, [facility], CRNA Referring MD:             Valaria Good. Karle Starch Medicines:                Monitored Anesthesia Care Complications:            No immediate complications. Estimated Blood Loss:     Estimated blood loss was minimal. Procedure:                Pre-Anesthesia Assessment:                           - Prior to the procedure, a History and Physical                            was performed, and patient medications and                            allergies were reviewed. The patient's tolerance of                            previous anesthesia was also reviewed. The risks                            and benefits of the procedure and the sedation                            options and risks were discussed with the patient.                            All questions were answered, and informed consent                            was obtained. Prior Anticoagulants: The patient has                            taken no previous anticoagulant or antiplatelet                            agents. ASA Grade Assessment: II - A patient with                            mild systemic disease. After reviewing the risks  and benefits, the patient was deemed in                            satisfactory condition to undergo the procedure.                           After obtaining informed consent, the endoscope was                            passed under direct vision. Throughout the                            procedure, the  patient's blood pressure, pulse, and                            oxygen saturations were monitored continuously. The                            GIF-H190 ([id number]) Olympus adult endoscope was                            introduced through the mouth, and advanced to the                            second part of duodenum. The upper GI endoscopy was                            accomplished without difficulty. The patient                            tolerated the procedure well. Scope In: Scope Out: Findings:      In the proximal esophagus there was a small inlet patch without       nodularity or visible abnormality.      Mild inflammation characterized by linear furrowing and subtle rings was       found in the middle third of the esophagus and in the lower third of the       esophagus. Biopsies were obtained from the proximal and distal esophagus       with cold forceps for histology to exclude eosinophilic esophagitis.      A 2 cm hiatal hernia was present.      A non-obstructing Schatzki ring was found at the gastroesophageal       junction.      Mild inflammation characterized by erythema was found in the gastric       body and in the gastric antrum. Biopsies were taken with a cold forceps       for histology and Helicobacter pylori testing.      The examined duodenum was normal. Biopsies for histology were taken with       a cold forceps for evaluation of celiac disease. Impression:               - Mild esophagitis, probably reflux related.                            Biopsied.                           -  2 cm hiatal hernia.                           - Non-obstructing Schatzki ring.                           - Gastritis. Biopsied.                           - Normal examined duodenum. Biopsied. Moderate Sedation:      N/A Recommendation:           - Patient has a contact number available for                            emergencies. The signs and symptoms of potential                             delayed complications were discussed with the                            patient. Return to normal activities tomorrow.                            Written discharge instructions were provided to the                            patient.                           - Resume previous diet.                           - Continue present medications.                           - Await pathology results.                           - See the other procedure note for documentation of                            additional recommendations. Procedure Code(s):        --- Professional ---                           [phone], Esophagogastroduodenoscopy, flexible,                            transoral; with biopsy, single or multiple Diagnosis Code(s):        --- Professional ---                           K22.10, Ulcer of esophagus without bleeding                           K44.9, Diaphragmatic hernia without obstruction or  gangrene                           K22.2, Esophageal obstruction                           K29.70, Gastritis, unspecified, without bleeding                           R10.11, Right upper quadrant pain                           R11.0, Nausea CPT copyright 2018 [employer]. All rights reserved. The codes documented in this report are preliminary and upon coder review may  be revised to meet current compliance requirements. Jerene Bears, MD 10/23/2018 10:59:10 AM This report has been signed electronically. Number of Addenda: 0

## 2018-10-23 NOTE — Discharge Instructions (Signed)
YOU HAD AN ENDOSCOPIC PROCEDURE TODAY: Refer to the procedure report and other information in the discharge instructions given to you for any specific questions about what was found during the examination. If this information does not answer your questions, please call [place] office at [phone] to clarify.  ° °YOU SHOULD EXPECT: Some feelings of bloating in the abdomen. Passage of more gas than usual. Walking can help get rid of the air that was put into your GI tract during the procedure and reduce the bloating. If you had a lower endoscopy (such as a colonoscopy or flexible sigmoidoscopy) you may notice spotting of blood in your stool or on the toilet paper. Some abdominal soreness may be present for a day or two, also. ° °DIET: Your first meal following the procedure should be a light meal and then it is ok to progress to your normal diet. A half-sandwich or bowl of soup is an example of a good first meal. Heavy or fried foods are harder to digest and may make you feel nauseous or bloated. Drink plenty of fluids but you should avoid alcoholic beverages for 24 hours. If you had a esophageal dilation, please see attached instructions for diet.   ° °ACTIVITY: Your care partner should take you home directly after the procedure. You should plan to take it easy, moving slowly for the rest of the day. You can resume normal activity the day after the procedure however YOU SHOULD NOT DRIVE, use power tools, machinery or perform tasks that involve climbing or major physical exertion for 24 hours (because of the sedation medicines used during the test).  ° °SYMPTOMS TO REPORT IMMEDIATELY: °A gastroenterologist can be reached at any hour. Please call [phone]  for any of the following symptoms:  °Following lower endoscopy (colonoscopy, flexible sigmoidoscopy) °Excessive amounts of blood in the stool  °Significant tenderness, worsening of abdominal pains  °Swelling of the abdomen that is new, acute  °Fever of 100° or  higher  °Following upper endoscopy (EGD, EUS, ERCP, esophageal dilation) °Vomiting of blood or coffee ground material  °New, significant abdominal pain  °New, significant chest pain or pain under the shoulder blades  °Painful or persistently difficult swallowing  °New shortness of breath  °Black, tarry-looking or red, bloody stools ° °FOLLOW UP:  °If any biopsies were taken you will be contacted by phone or by letter within the next 1-3 weeks. Call [phone]  if you have not heard about the biopsies in 3 weeks.  °Please also call with any specific questions about appointments or follow up tests. ° °

## 2018-10-23 NOTE — Anesthesia Preprocedure Evaluation (Addendum)
Anesthesia Evaluation  Patient identified by MRN, date of birth, ID band Patient awake    Reviewed: Allergy & Precautions, NPO status , Patient's Chart, lab work & pertinent test results  Airway Mallampati: II  TM Distance: >3 FB Neck ROM: Full    Dental  (+) Teeth Intact, Dental Advisory Given   Pulmonary    breath sounds clear to auscultation       Cardiovascular  Rhythm:Regular Rate:Normal     Neuro/Psych    GI/Hepatic   Endo/Other    Renal/GU      Musculoskeletal   Abdominal   Peds  Hematology   Anesthesia Other Findings   Reproductive/Obstetrics                             Anesthesia Physical Anesthesia Plan  ASA: II  Anesthesia Plan: MAC   Post-op Pain Management:    Induction:   PONV Risk Score and Plan: Propofol infusion  Airway Management Planned: Natural Airway and Nasal Cannula  Additional Equipment:   Intra-op Plan:   Post-operative Plan:   Informed Consent: I have reviewed the patients History and Physical, chart, labs and discussed the procedure including the risks, benefits and alternatives for the proposed anesthesia with the patient or authorized representative who has indicated his/her understanding and acceptance.   Dental advisory given  Plan Discussed with: CRNA and Anesthesiologist  Anesthesia Plan Comments: (S/P laryngospam during EGD at [facility] office   Plan MAC, may need to progress to [place] if Laryngospasm develops again.  Roberts Gaudy)       Anesthesia Quick Evaluation

## 2018-10-23 NOTE — H&P (Signed)
HPI: Natasha Fowler is a 58 year old female with a history of GERD, chronic nausea, right upper abdominal pain, chronic cough, thoracic aortic aneurysm, vocal cord paralysis, sarcoid and anxiety here for upper endoscopy and colonoscopy.  EGD and colonoscopy were scheduled in the [facility] but she had laryngeal spasm preventing upper endoscopy.  Colonoscopy was attempted unsedated but due to discomfort was aborted in the descending colon.    Past Medical History:  Diagnosis Date  . Allergy   . Anxiety   . Colon polyp   . Complication of anesthesia    paralyzed vocal cord has spasms  . Dyspnea   . Gastritis   . GERD (gastroesophageal reflux disease)   . History of hiatal hernia   . Horner's syndrome   . Horner's syndrome   . Horner's syndrome   . Hyperlipidemia   . Osteoporosis   . Sarcoidosis   . Thoracic aortic aneurysm [facility])    Pulmonologist follows Gabrielle Dare is Cardiologist  . Vocal cord paralysis   . Vocal cord paralysis, unilateral complete 04/06/2016   left, noted on bronchoscopy    Past Surgical History:  Procedure Laterality Date  . TONSILLECTOMY     age 23 , have grown back per Dr  . Tamsen Roers BRONCHOSCOPY Bilateral 04/06/2016   Procedure: VIDEO BRONCHOSCOPY WITH FLUORO;  Surgeon: Juanito Doom, MD;  Location: [facility] ENDOSCOPY;  Service: Cardiopulmonary;  Laterality: Bilateral;    (Not in an outpatient encounter)   Allergies  Allergen Reactions  . Penicillins Anaphylaxis    Throat swelling DID THE REACTION INVOLVE: Swelling of the face/tongue/throat, SOB, or low BP? Yes Sudden or severe rash/hives, skin peeling, or the inside of the mouth or nose? No Did it require medical treatment? Yes When did it last happen?30+ years If all above answers are "NO", may proceed with cephalosporin use.   Diona Fanti [Aspirin] Other (See Comments)    GI upset  . Codeine Hives  . Eggs Or Egg-Derived Products     Tested positive for egg yolk, Pt eats eggs without any reaction   .  Prilosec [Omeprazole]     Epigastric pressure, gas  . Protonix [Pantoprazole Sodium]     Epigastric pressure, gas   . Sulfa Antibiotics Hives    Family History  Problem Relation Age of Onset  . Colon cancer Maternal Grandmother        in her 99's  . Colon polyps Mother   . Heart disease Father   . Stomach cancer Neg Hx   . Pancreatic cancer Neg Hx   . Esophageal cancer Neg Hx     Social History   Tobacco Use  . Smoking status: Never Smoker  . Smokeless tobacco: Never Used  Substance Use Topics  . Alcohol use: Yes    Alcohol/week: 0.0 standard drinks    Comment: Socially  2 x a month  . Drug use: No    ROS: As per history of present illness, otherwise negative  BP 134/68   Pulse 76   Temp 98 F (36.7 C) (Oral)   Resp 20   Ht 5\' 7"  (1.702 m)   Wt 56 kg   SpO2 100%   BMI 19.33 kg/m  Gen: awake, alert, NAD HEENT: anicteric, op clear CV: RRR, no mrg Pulm: CTA b/l Abd: soft, NT/ND, +BS throughout Ext: no c/c/e Neuro: nonfocal   ASSESSMENT/PLAN: 58 year old female with a history of GERD, chronic nausea, right upper abdominal pain, chronic cough, thoracic aortic aneurysm, vocal cord paralysis, sarcoid and anxiety  here for upper endoscopy and colonoscopy.  1.  GERD/nausea/right upper quadrant pain --EGD today with monitored anesthesia care.  Anesthesia aware of potential for laryngeal spasm.  Rule out gastritis, upper GI sarcoid, H. Pylori.  2.  Loose stools/abdominal pain --colonoscopy today.  Doubt microscopic colitis but will biopsy to rule out.  Describes stool as greasy, if all unremarkable trial of pancreatic enzyme replacement.      Cc:No referring provider defined for this encounter.

## 2018-10-23 NOTE — Anesthesia Postprocedure Evaluation (Signed)
Anesthesia Post Note  Patient: Natasha Fowler  Procedure(s) Performed: COLONOSCOPY WITH PROPOFOL (N/A ) ESOPHAGOGASTRODUODENOSCOPY (EGD) WITH PROPOFOL (N/A ) BIOPSY POLYPECTOMY     Patient location during evaluation: PACU Anesthesia Type: MAC Level of consciousness: awake and alert Pain management: pain level controlled Vital Signs Assessment: post-procedure vital signs reviewed and stable Respiratory status: spontaneous breathing, nonlabored ventilation, respiratory function stable and patient connected to nasal cannula oxygen Cardiovascular status: stable and blood pressure returned to baseline Postop Assessment: no apparent nausea or vomiting Anesthetic complications: no    Last Vitals:  Vitals:   10/23/18 1120 10/23/18 1130  BP: 102/62 102/66  Pulse: 78 72  Resp: 20 20  Temp:    SpO2: 100% 100%    Last Pain:  Vitals:   10/23/18 1100  TempSrc: Oral  PainSc:                  Lora Chavers COKER

## 2018-10-23 NOTE — Op Note (Signed)
[facility] Patient Name: Natasha Fowler Procedure Date: 10/23/2018 MRN: [id number] Attending MD: Jerene Bears , MD Date of Birth: 1961-05-27 CSN: [id number] Age: 58 Admit Type: Outpatient Procedure:                Colonoscopy Indications:              Colon cancer screening in patient at increased                            risk: Family history of 1st-degree relative with                            colon polyps, Incidental change in bowel habits                            noted, Incidental diarrhea noted Providers:                Lajuan Lines. Hilarie Fredrickson, MD, Burtis Junes, RN, Dorise Hiss, RN,                            Charolette Child, Technician, [facility], CRNA Referring MD:             Valaria Good. Karle Starch Medicines:                Monitored Anesthesia Care Complications:            No immediate complications. Estimated Blood Loss:     Estimated blood loss was minimal. Procedure:                Pre-Anesthesia Assessment:                           - Prior to the procedure, a History and Physical                            was performed, and patient medications and                            allergies were reviewed. The patient's tolerance of                            previous anesthesia was also reviewed. The risks                            and benefits of the procedure and the sedation                            options and risks were discussed with the patient.                            All questions were answered, and informed consent                            was obtained. Prior Anticoagulants: The patient has  taken no previous anticoagulant or antiplatelet                            agents. ASA Grade Assessment: II - A patient with                            mild systemic disease. After reviewing the risks                            and benefits, the patient was deemed in                            satisfactory condition to undergo the  procedure.                           After obtaining informed consent, the colonoscope                            was passed under direct vision. Throughout the                            procedure, the patient's blood pressure, pulse, and                            oxygen saturations were monitored continuously. The                            PCF-H190DL ([id number]) Olympus peds colonoscope was                            introduced through the anus and advanced to the                            cecum, identified by appendiceal orifice and                            ileocecal valve. The colonoscopy was performed                            without difficulty. The patient tolerated the                            procedure well. The quality of the bowel                            preparation was good. The ileocecal valve,                            appendiceal orifice, and rectum were photographed. Scope In: 10:31:12 AM Scope Out: 10:43:21 AM Scope Withdrawal Time: 0 hours 10 minutes 42 seconds  Total Procedure Duration: 0 hours 12 minutes 9 seconds  Findings:      The digital rectal exam was normal.      A 3 mm polyp was found in the cecum. The polyp  was sessile. The polyp       was removed with a cold snare. Resection and retrieval were complete.      A 5 mm polyp was found in the sigmoid colon. The polyp was sessile. The       polyp was removed with a cold snare. Resection and retrieval were       complete.      A few small-mouthed diverticula were found in the sigmoid colon.      The exam was otherwise without abnormality on direct and retroflexion       views.      Biopsies for histology were taken with a cold forceps from the right       colon and left colon for evaluation of microscopic colitis. Impression:               - One 3 mm polyp in the cecum, removed with a cold                            snare. Resected and retrieved.                           - One 5 mm polyp in the sigmoid  colon, removed with                            a cold snare. Resected and retrieved.                           - Mild diverticulosis in the sigmoid colon.                           - The examination was otherwise normal on direct                            and retroflexion views.                           - Biopsies were taken with a cold forceps from the                            right colon and left colon for evaluation of                            microscopic colitis. Moderate Sedation:      N/A Recommendation:           - Patient has a contact number available for                            emergencies. The signs and symptoms of potential                            delayed complications were discussed with the                            patient. Return to normal activities tomorrow.  Written discharge instructions were provided to the                            patient.                           - Resume previous diet.                           - Continue present medications.                           - Await pathology results.                           - Repeat colonoscopy is recommended. The                            colonoscopy date will be determined after pathology                            results from today's exam become available for                            review. Procedure Code(s):        --- Professional ---                           [phone], Colonoscopy, flexible; with removal of                            tumor(s), polyp(s), or other lesion(s) by snare                            technique                           45380, 68, Colonoscopy, flexible; with biopsy,                            single or multiple Diagnosis Code(s):        --- Professional ---                           Z83.71, Family history of colonic polyps                           D12.0, Benign neoplasm of cecum                           D12.5, Benign neoplasm of sigmoid  colon                           K57.30, Diverticulosis of large intestine without                            perforation or abscess without bleeding CPT copyright 2018 American Medical Association. All  rights reserved. The codes documented in this report are preliminary and upon coder review may  be revised to meet current compliance requirements. Jerene Bears, MD 10/23/2018 11:06:02 AM This report has been signed electronically. Number of Addenda: 0

## 2018-10-23 NOTE — Transfer of Care (Signed)
Immediate Anesthesia Transfer of Care Note  Patient: Natasha Fowler  Procedure(s) Performed: COLONOSCOPY WITH PROPOFOL (N/A ) ESOPHAGOGASTRODUODENOSCOPY (EGD) WITH PROPOFOL (N/A ) BIOPSY POLYPECTOMY  Patient Location: PACU  Anesthesia Type:MAC  Level of Consciousness: awake, alert  and oriented  Airway & Oxygen Therapy: Patient Spontanous Breathing and Patient connected to nasal cannula oxygen  Post-op Assessment: Report given to RN and Post -op Vital signs reviewed and stable  Post vital signs: Reviewed and stable  Last Vitals:  Vitals Value Taken Time  BP 104/63 10/23/2018 10:56 AM  Temp    Pulse 94 10/23/2018 10:59 AM  Resp 24 10/23/2018 10:59 AM  SpO2 100 % 10/23/2018 10:59 AM  Vitals shown include unvalidated device data.  Last Pain:  Vitals:   10/23/18 0810  TempSrc: Oral  PainSc: 0-No pain         Complications: No apparent anesthesia complications

## 2018-10-25 ENCOUNTER — Encounter (HOSPITAL_COMMUNITY): Payer: Self-pay | Admitting: Internal Medicine

## 2018-10-25 ENCOUNTER — Telehealth: Payer: Self-pay | Admitting: Internal Medicine

## 2018-10-25 NOTE — Telephone Encounter (Signed)
Per med history she was taking zofran 4mg  every 8 hours.

## 2018-10-25 NOTE — Telephone Encounter (Signed)
What was her nausea med?  I can't tell by chart review I am okay with antinausea med if needed

## 2018-10-25 NOTE — Telephone Encounter (Signed)
Pt wanted to speak with nurse with question about her procedure that was done on 10/23/2018

## 2018-10-25 NOTE — Telephone Encounter (Signed)
Pt states that after her ECL she was told to stop her nausea medication. States she was not given anything else to take and she hasn't been given anything else to take. Pt wanted to make sure she understood things correctly. Please advise.

## 2018-10-25 NOTE — Telephone Encounter (Signed)
Left message for pt to call back  °

## 2018-10-25 NOTE — Telephone Encounter (Signed)
Pt returned call and would like another call.

## 2018-10-25 NOTE — Telephone Encounter (Signed)
[place] for to continue this med PRN nausea

## 2018-10-26 NOTE — Telephone Encounter (Signed)
The pt was advised and will take as needed.

## 2018-10-31 ENCOUNTER — Other Ambulatory Visit: Payer: Self-pay

## 2018-10-31 MED ORDER — FAMOTIDINE 20 MG PO TABS: 20.0000 mg | ORAL_TABLET | Freq: Two times a day (BID) | ORAL | 6 refills | Status: AC

## 2018-11-02 ENCOUNTER — Ambulatory Visit: Payer: Commercial Managed Care - PPO

## 2018-12-13 ENCOUNTER — Ambulatory Visit
Admission: RE | Admit: 2018-12-13 | Discharge: 2018-12-13 | Disposition: A | Discharge status: Home / Self Care | Payer: Commercial Managed Care - PPO | Source: Ambulatory Visit | Attending: Internal Medicine | Admitting: Internal Medicine

## 2018-12-13 ENCOUNTER — Ambulatory Visit: Payer: Commercial Managed Care - PPO | Admitting: Internal Medicine

## 2018-12-13 DIAGNOSIS — Z1231 Encounter for screening mammogram for malignant neoplasm of breast: Secondary | ICD-10-CM

## 2019-01-12 ENCOUNTER — Telehealth: Payer: Self-pay

## 2019-01-12 NOTE — Telephone Encounter (Signed)
Spoke with pt and moved her appt from 4/3 to 4/2@4pm . Pt aware this will be a virtual visit through zoom. She requests visit via following email address:  [email or web address]

## 2019-01-18 ENCOUNTER — Ambulatory Visit (INDEPENDENT_AMBULATORY_CARE_PROVIDER_SITE_OTHER): Payer: Commercial Managed Care - PPO | Admitting: Internal Medicine

## 2019-01-18 ENCOUNTER — Other Ambulatory Visit: Payer: Self-pay

## 2019-01-18 ENCOUNTER — Encounter: Payer: Self-pay | Admitting: Internal Medicine

## 2019-01-18 DIAGNOSIS — R110 Nausea: Secondary | ICD-10-CM

## 2019-01-18 DIAGNOSIS — K909 Intestinal malabsorption, unspecified: Secondary | ICD-10-CM

## 2019-01-18 DIAGNOSIS — R195 Other fecal abnormalities: Secondary | ICD-10-CM | POA: Diagnosis not present

## 2019-01-18 DIAGNOSIS — K9049 Malabsorption due to intolerance, not elsewhere classified: Secondary | ICD-10-CM

## 2019-01-18 DIAGNOSIS — R109 Unspecified abdominal pain: Secondary | ICD-10-CM

## 2019-01-18 NOTE — Progress Notes (Signed)
Subjective:    Patient ID: Natasha Fowler, female    DOB: [birth year], 58 y.o.   MRN: [id number]   This service was provided via telemedicine on the [place].   The patient was located at home The provider was located in GI office The patient did consent to this telephone visit and is aware of possible charges through their insurance for this visit.   The other persons participating in this telemedicine service were patient and myself  Time spent on call: 17 min   HPI Natasha Fowler is a 58 year old female with a history of GERD, chronic nausea, right upper quadrant abdominal pain, chronic cough, thoracic aortic aneurysm, vocal cord paralysis, sarcoid and anxiety who is seen virtually for follow-up.  She was last seen at the time of upper endoscopy and colonoscopy which were performed on 10/23/2018.  EGD revealed mild linear furrowing in the esophagus which was biopsied, small hiatal hernia with nonobstructing Schatzki's ring, mild antral gastritis and a normal-appearing duodenum.  Pathology showed benign esophagus, chronic inactive gastritis in the stomach without H. pylori and a normal small bowel.  No evidence for sarcoidosis.  Colonoscopy revealed 2 small sessile serrated polyps and random biopsies negative for inflammation/microscopic colitis.  After her procedure I recommended famotidine 20 mg twice a day as GERD was felt to be contributing to her epigastric pressure.  She had previously been intolerant to PPIs.  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  She has been using Pepcid AC once a day.  When she uses it twice a day she gets the same reaction she had with the PPI which is upper abdominal pressure and pressure under her shoulder blades.  She is now using this once a day and is not having this.  Her nausea is significantly better  happening maybe 1 day/week.  No vomiting.  She has given up dairy products entirely.  She will still develop "airy stools", pain in the right side and looseness to the stool when she eats a high fatty meal.  She has been using high-dose vitamin C recently in the setting of trying to stay healthy and avoid COVID-19.   Review of Systems As per HPI, otherwise negative  Current Medications, Allergies, Past Medical History, Past Surgical History, Family History and Social History were reviewed in [employer] record.     Objective:   Physical Exam No physical exam, virtual visit       Assessment & Plan:  58 year old female with a history of GERD, chronic nausea, right upper quadrant abdominal pain, chronic cough, thoracic aortic aneurysm, vocal cord paralysis, sarcoid and anxiety who is seen virtually for follow-up.   1.  Intermittent loose stools and right-sided abdominal pain --better with dairy avoidance.  This may be an irritable bowel type symptom.  I would like to try her on pancreatic enzymes for an element of pancreatic insufficiency to see if this improves her abdominal discomfort, change in bowel characteristics directly related to high fatty foods. --Begin Creon 36,000 units; 2 capsules with meals, 1 with snacks --please send/mail her a Creon coupon so that she can try this medication.  Asked that she try this for 2 weeks and then let me know if it is helping her. --Continue dairy avoidance  2.  Nausea/probable GERD --symptoms have improved, possibly related to dairy avoidance.  Pepcid  AC once daily seems to be helping.  She will continue this dose for now.  If Creon is dramatically beneficial she may consider stopping Pepcid and assessing response.  3.  Sessile serrated colon polyps --found in January at colonoscopy.  Repeat colonoscopy recommended in 5 years, January 2025

## 2019-01-19 ENCOUNTER — Ambulatory Visit: Payer: Commercial Managed Care - PPO | Admitting: Internal Medicine

## 2019-08-03 ENCOUNTER — Other Ambulatory Visit: Payer: Self-pay | Admitting: Obstetrics and Gynecology

## 2019-08-03 DIAGNOSIS — N6310 Unspecified lump in the right breast, unspecified quadrant: Secondary | ICD-10-CM

## 2019-08-09 ENCOUNTER — Other Ambulatory Visit: Payer: Self-pay

## 2019-08-09 ENCOUNTER — Ambulatory Visit
Admission: RE | Admit: 2019-08-09 | Discharge: 2019-08-09 | Disposition: A | Discharge status: Home / Self Care | Payer: Commercial Managed Care - PPO | Source: Ambulatory Visit | Attending: Obstetrics and Gynecology | Admitting: Obstetrics and Gynecology

## 2019-08-09 DIAGNOSIS — N6310 Unspecified lump in the right breast, unspecified quadrant: Secondary | ICD-10-CM

## 2020-11-05 ENCOUNTER — Other Ambulatory Visit: Payer: Self-pay | Admitting: Internal Medicine

## 2020-11-05 DIAGNOSIS — Z1231 Encounter for screening mammogram for malignant neoplasm of breast: Secondary | ICD-10-CM

## 2020-12-17 ENCOUNTER — Ambulatory Visit: Payer: Commercial Managed Care - PPO

## 2021-05-26 ENCOUNTER — Other Ambulatory Visit: Payer: Self-pay | Admitting: Internal Medicine

## 2021-05-28 ENCOUNTER — Other Ambulatory Visit: Payer: Self-pay | Admitting: Obstetrics and Gynecology

## 2021-05-28 DIAGNOSIS — N6001 Solitary cyst of right breast: Secondary | ICD-10-CM

## 2021-07-09 ENCOUNTER — Other Ambulatory Visit: Payer: Self-pay

## 2021-08-07 ENCOUNTER — Ambulatory Visit
Admission: RE | Admit: 2021-08-07 | Discharge: 2021-08-07 | Disposition: A | Discharge status: Home / Self Care | Payer: 59 | Source: Ambulatory Visit | Attending: Internal Medicine | Admitting: Internal Medicine

## 2021-08-07 ENCOUNTER — Other Ambulatory Visit: Payer: Self-pay

## 2021-08-07 DIAGNOSIS — Z1231 Encounter for screening mammogram for malignant neoplasm of breast: Secondary | ICD-10-CM

## 2022-04-01 ENCOUNTER — Ambulatory Visit
Admission: RE | Admit: 2022-04-01 | Discharge: 2022-04-01 | Disposition: A | Discharge status: Home / Self Care | Payer: BC Managed Care – PPO | Source: Ambulatory Visit | Attending: Obstetrics and Gynecology | Admitting: Obstetrics and Gynecology

## 2022-04-01 ENCOUNTER — Ambulatory Visit
Admission: RE | Admit: 2022-04-01 | Discharge: 2022-04-01 | Disposition: A | Discharge status: Home / Self Care | Payer: 59 | Source: Ambulatory Visit | Attending: Obstetrics and Gynecology | Admitting: Obstetrics and Gynecology

## 2022-04-01 ENCOUNTER — Other Ambulatory Visit: Payer: Self-pay | Admitting: Obstetrics and Gynecology

## 2022-04-01 DIAGNOSIS — N6320 Unspecified lump in the left breast, unspecified quadrant: Secondary | ICD-10-CM

## 2022-04-01 DIAGNOSIS — N6001 Solitary cyst of right breast: Secondary | ICD-10-CM

## 2022-04-01 IMAGING — MG DIGITAL DIAGNOSTIC BILAT W/ TOMO W/ CAD
6 of 12 series · 6 of 36 positions shown · non-contrast
Comparison: Previous exam(s).

CLINICAL DATA: Patient has 2 palpable abnormalities in the RIGHT
breast. One of these areas was recently aspirated by Dr. ZEINAB.
Patient has 1 palpable abnormality in the LEFT breast. History of
cysts.



[L MLO synth-2D]
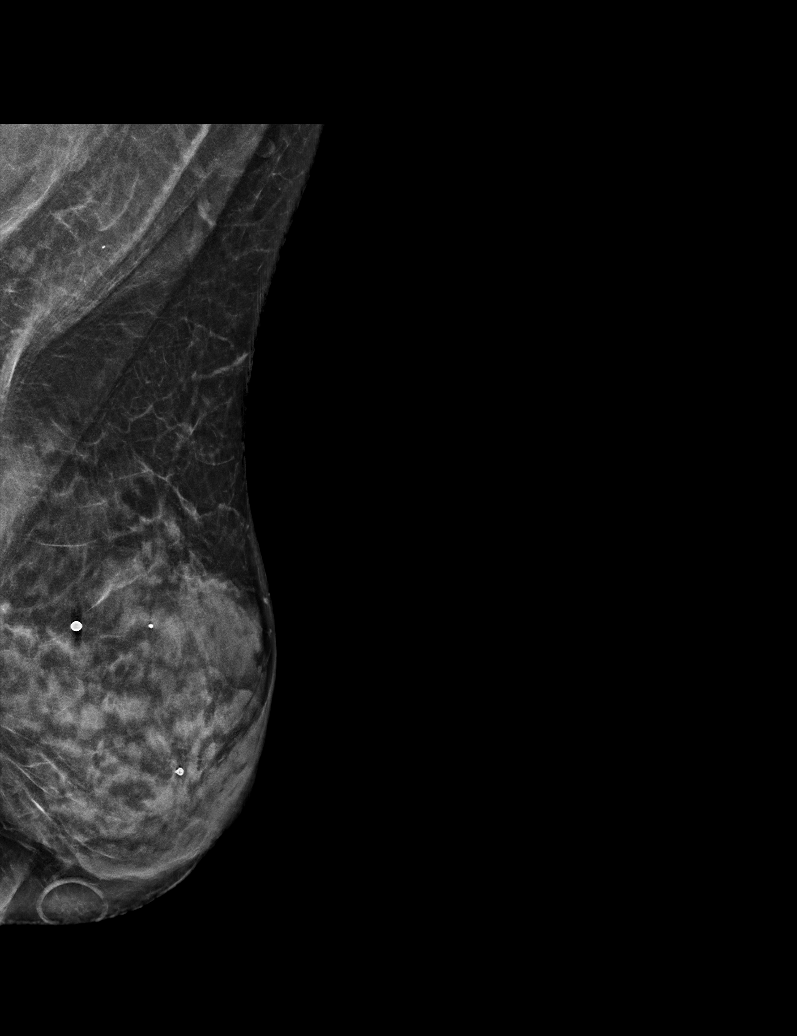

[L CC synth-2D]
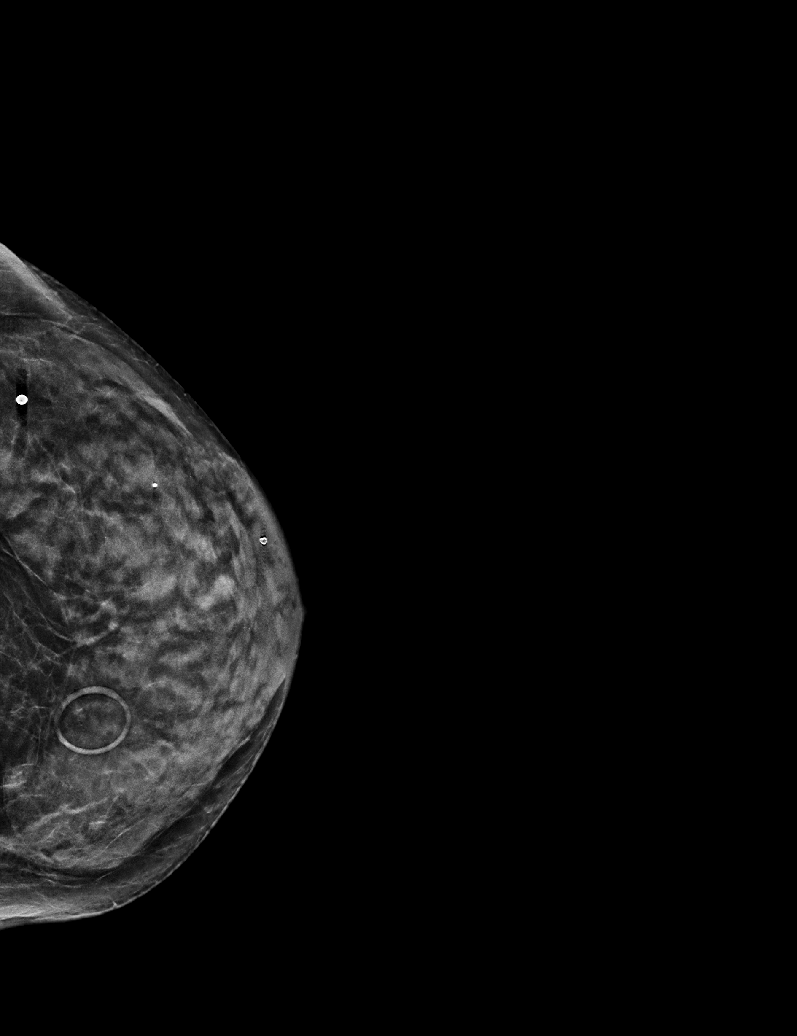

[R CC synth-2D]
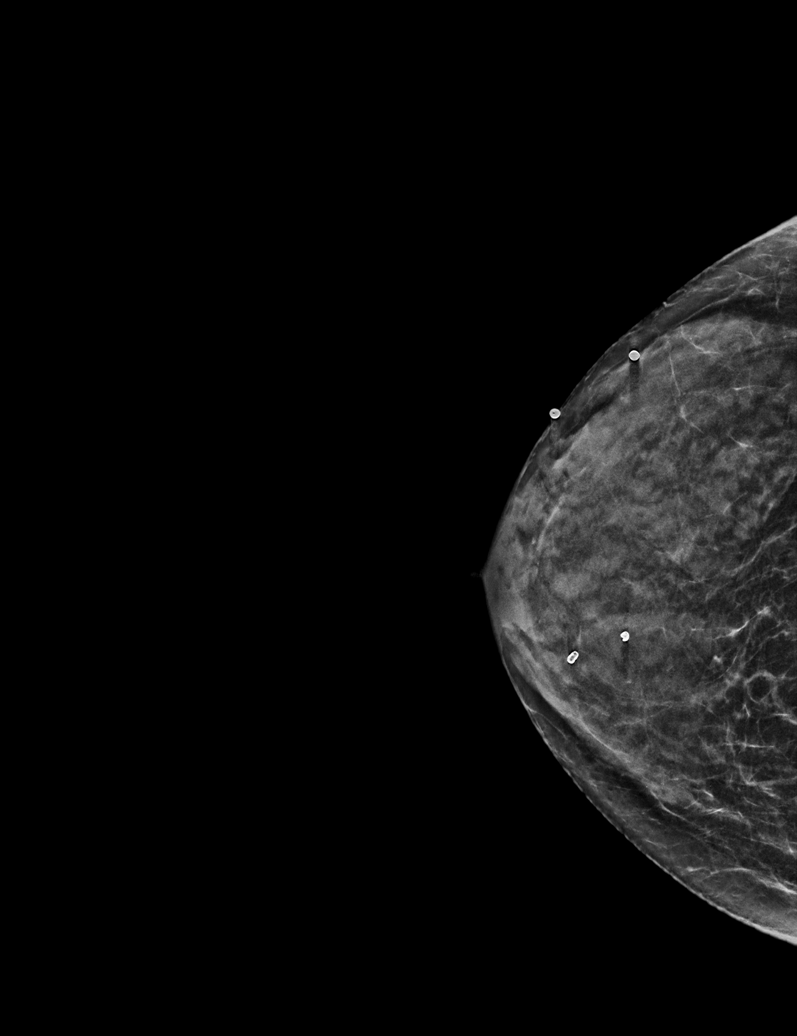

[L TAN synth-2D]
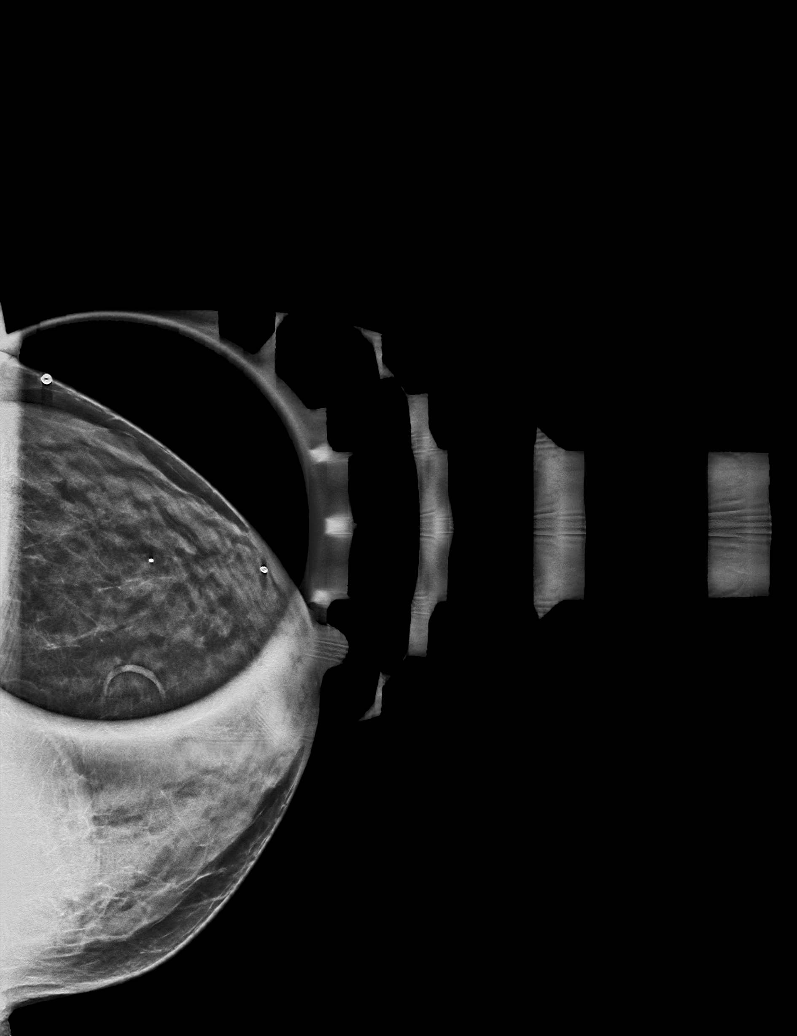

[R MLO synth-2D]
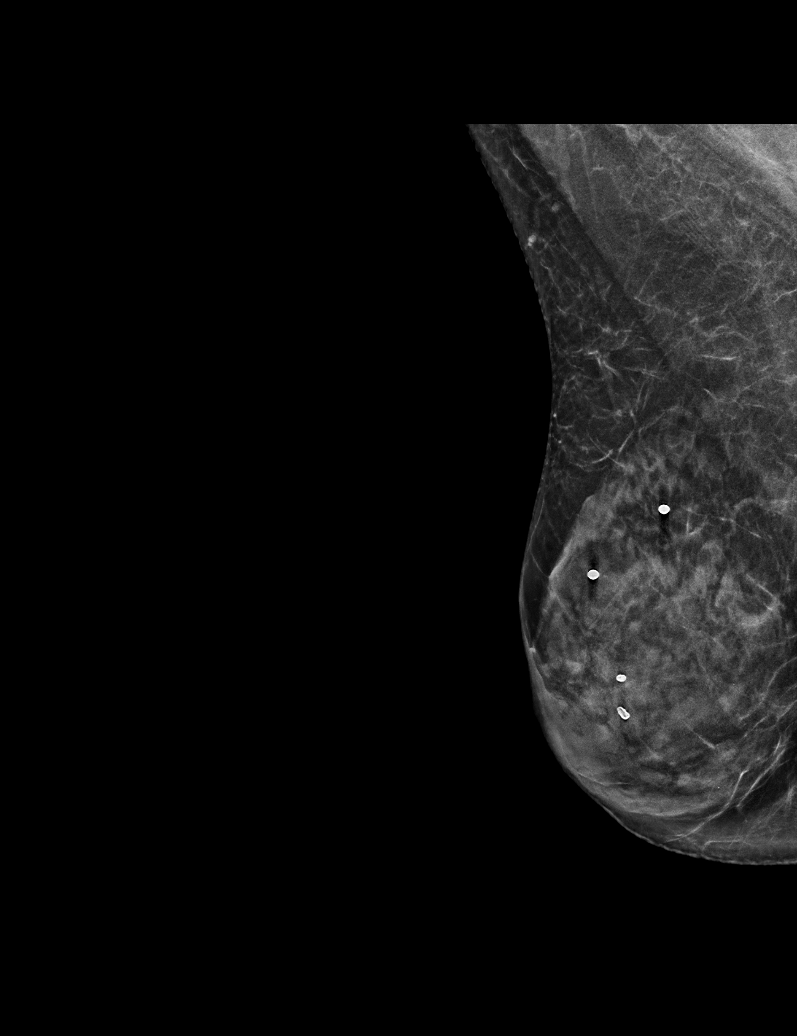

[R TAN synth-2D]
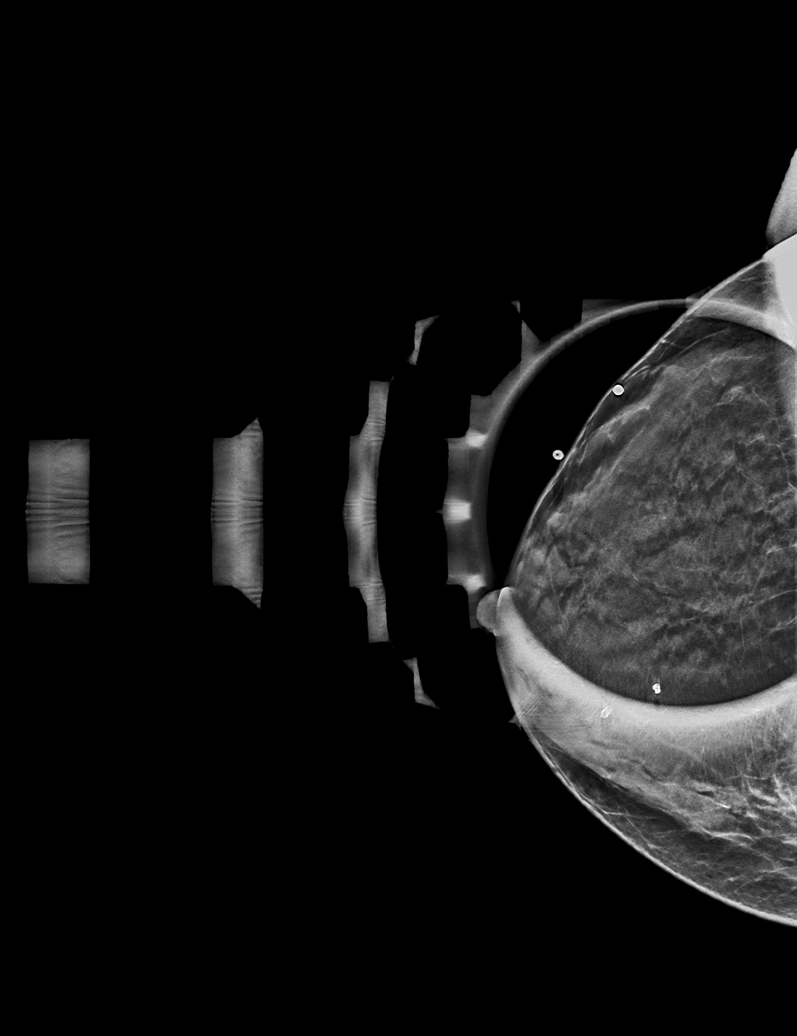

[6 of 36 positions shown; findings below may reference images not displayed]

ACR Breast Density Category d: The breast tissue is extremely dense,
which lowers the sensitivity of mammography.
FINDINGS: RIGHT BREAST:

Mammogram: Spot tangential view is performed in the areas of
concern. In this region, partially obscured oval mass is present. No
suspicious microcalcifications or distortion. mammographic images
were processed with CAD.

Physical Exam: I palpate 2 adjacent mobile oval smooth masses in the
LATERAL portion of the RIGHT breast. Patient is not tender on exam.

Ultrasound: Targeted ultrasound is performed, showing adjacent
simple cyst with mobile internal debris in the 9 o'clock location of
the RIGHT breast. Cyst measuring 1.1 x 0.9 x 1.2 centimeters is
identified 2 centimeters from the nipple. Cyst measuring 1.1 x 0.9 x
1.1 centimeters is present in the 9 o'clock location 4 centimeters
from the nipple.

LEFT BREAST:

Mammogram: Spot tangential view is performed in the area of concern
and shows dense fibroglandular tissue in the LATERAL LEFT breast. No
suspicious mass, distortion, or microcalcifications are identified
to suggest presence of malignancy. Mammographic images were
processed with CAD.

Physical Exam: I palpate a BB sized mass in the 3 o'clock location
of the LEFT breast in the area of concern.

Ultrasound: Targeted ultrasound is performed, showing normal
appearing extremely dense fibroglandular tissue in the 3 o'clock
location of the LEFT breast. No suspicious mass, distortion, or
acoustic shadowing is demonstrated with ultrasound.
IMPRESSION: Benign bilateral breast cysts.

No mammographic or ultrasound evidence for malignancy.

RECOMMENDATION:
Screening mammogram in one year.(Code:[GP])

I have discussed the findings and recommendations with the patient.
If applicable, a reminder letter will be sent to the patient
regarding the next appointment.

BI-RADS CATEGORY  2: Benign.

## 2024-07-11 ENCOUNTER — Encounter: Payer: Self-pay | Admitting: Internal Medicine
